# Patient Record
Sex: Male | Born: 1960 | Race: White | Hispanic: No | Marital: Single | Smoking: Never smoker
Health system: Southern US, Community
[De-identification: ages and names within clinical notes are randomized; demographics above are authoritative.]

## PROBLEM LIST (undated history)

## (undated) ENCOUNTER — Emergency Department: Payer: Self-pay

## (undated) DIAGNOSIS — Z86010 Personal history of colonic polyps: Secondary | ICD-10-CM

## (undated) DIAGNOSIS — N189 Chronic kidney disease, unspecified: Secondary | ICD-10-CM

## (undated) HISTORY — PX: HERNIA REPAIR: SHX51

## (undated) HISTORY — DX: Personal history of colonic polyps: Z86.010

---

## 2005-01-22 ENCOUNTER — Ambulatory Visit: Payer: Self-pay | Admitting: General Surgery

## 2006-03-11 ENCOUNTER — Ambulatory Visit: Payer: Self-pay | Admitting: Family Medicine

## 2006-04-07 ENCOUNTER — Ambulatory Visit: Payer: Self-pay | Admitting: Family Medicine

## 2010-09-23 ENCOUNTER — Encounter: Payer: Self-pay | Admitting: Family Medicine

## 2010-09-23 ENCOUNTER — Ambulatory Visit: Payer: Self-pay | Admitting: General Surgery

## 2010-10-01 NOTE — Op Note (Signed)
Summary: [facility] repair-Dr. Donnalee Curry   Hernia Repair-Dr. Donnalee Curry   Imported By: Maryln Gottron 09/27/2010 15:33:43  _____________________________________________________________________  External Attachment:    Type:   Image     Comment:   External Document

## 2011-10-14 ENCOUNTER — Ambulatory Visit: Payer: Self-pay | Admitting: General Surgery

## 2011-10-14 HISTORY — PX: COLONOSCOPY W/ BIOPSIES: SHX1374

## 2011-10-16 LAB — PATHOLOGY REPORT

## 2013-02-17 ENCOUNTER — Encounter: Payer: Self-pay | Admitting: *Deleted

## 2014-11-03 ENCOUNTER — Emergency Department
Admit: 2014-11-03 | Disposition: A | Discharge status: Home / Self Care | Payer: Self-pay | Admitting: Emergency Medicine

## 2014-11-03 LAB — CBC WITH DIFFERENTIAL/PLATELET
Basophil #: 0.1 10*3/uL (ref 0.0–0.1)
Basophil %: 1.2 %
Eosinophil #: 0.2 10*3/uL (ref 0.0–0.7)
Eosinophil %: 2.5 %
HCT: 43 % (ref 40.0–52.0)
HGB: 14.4 g/dL (ref 13.0–18.0)
Lymphocyte #: 3.5 10*3/uL (ref 1.0–3.6)
Lymphocyte %: 40.5 %
MCH: 30.4 pg (ref 26.0–34.0)
MCHC: 33.4 g/dL (ref 32.0–36.0)
MCV: 91 fL (ref 80–100)
Monocyte #: 0.8 x10 3/mm (ref 0.2–1.0)
Monocyte %: 9.4 %
NEUTROS PCT: 46.4 %
Neutrophil #: 4 10*3/uL (ref 1.4–6.5)
PLATELETS: 335 10*3/uL (ref 150–440)
RBC: 4.73 10*6/uL (ref 4.40–5.90)
RDW: 12.6 % (ref 11.5–14.5)
WBC: 8.6 10*3/uL (ref 3.8–10.6)

## 2014-11-03 LAB — BASIC METABOLIC PANEL
ANION GAP: 11 (ref 7–16)
BUN: 20 mg/dL
CREATININE: 0.96 mg/dL
Calcium, Total: 8.9 mg/dL
Chloride: 104 mmol/L
Co2: 22 mmol/L
Glucose: 118 mg/dL — ABNORMAL HIGH
POTASSIUM: 3.3 mmol/L — AB
Sodium: 137 mmol/L

## 2015-11-24 ENCOUNTER — Emergency Department: Payer: Worker's Compensation

## 2015-11-24 ENCOUNTER — Emergency Department
Admission: EM | Admit: 2015-11-24 | Discharge: 2015-11-24 | Disposition: A | Discharge status: Home / Self Care | Payer: Worker's Compensation | Attending: Emergency Medicine | Admitting: Emergency Medicine

## 2015-11-24 DIAGNOSIS — Y929 Unspecified place or not applicable: Secondary | ICD-10-CM | POA: Insufficient documentation

## 2015-11-24 DIAGNOSIS — F1299 Cannabis use, unspecified with unspecified cannabis-induced disorder: Secondary | ICD-10-CM | POA: Diagnosis not present

## 2015-11-24 DIAGNOSIS — S56811A Strain of other muscles, fascia and tendons at forearm level, right arm, initial encounter: Secondary | ICD-10-CM | POA: Diagnosis not present

## 2015-11-24 DIAGNOSIS — X509XXA Other and unspecified overexertion or strenuous movements or postures, initial encounter: Secondary | ICD-10-CM | POA: Insufficient documentation

## 2015-11-24 DIAGNOSIS — Y939 Activity, unspecified: Secondary | ICD-10-CM | POA: Insufficient documentation

## 2015-11-24 DIAGNOSIS — Y999 Unspecified external cause status: Secondary | ICD-10-CM | POA: Diagnosis not present

## 2015-11-24 DIAGNOSIS — S46911A Strain of unspecified muscle, fascia and tendon at shoulder and upper arm level, right arm, initial encounter: Secondary | ICD-10-CM

## 2015-11-24 DIAGNOSIS — S59901A Unspecified injury of right elbow, initial encounter: Secondary | ICD-10-CM | POA: Diagnosis present

## 2015-11-24 MED ORDER — MELOXICAM 15 MG PO TABS: 15.0000 mg | ORAL_TABLET | Freq: Every day | ORAL | Status: DC

## 2015-11-24 NOTE — ED Notes (Addendum)
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 Pain and swelling to right elbow. Heard popping sound

## 2015-11-24 NOTE — Discharge Instructions (Signed)
Cryotherapy °Cryotherapy means treatment with cold. Ice or gel packs can be used to reduce both pain and swelling. Ice is the most helpful within the first 24 to 48 hours after an injury or flare-up from overusing a muscle or joint. Sprains, strains, spasms, burning pain, shooting pain, and aches can all be eased with ice. Ice can also be used when recovering from surgery. Ice is effective, has very few side effects, and is safe for most people to use. °PRECAUTIONS  °Ice is not a safe treatment option for people with: °· Raynaud phenomenon. This is a condition affecting small blood vessels in the extremities. Exposure to cold may cause your problems to return. °· Cold hypersensitivity. There are many forms of cold hypersensitivity, including: °· Cold urticaria. Red, itchy hives appear on the skin when the tissues begin to warm after being iced. °· Cold erythema. This is a red, itchy rash caused by exposure to cold. °· Cold hemoglobinuria. Red blood cells break down when the tissues begin to warm after being iced. The hemoglobin that carry oxygen are passed into the urine because they cannot combine with blood proteins fast enough. °· Numbness or altered sensitivity in the area being iced. °If you have any of the following conditions, do not use ice until you have discussed cryotherapy with your caregiver: °· Heart conditions, such as arrhythmia, angina, or chronic heart disease. °· High blood pressure. °· Healing wounds or open skin in the area being iced. °· Current infections. °· Rheumatoid arthritis. °· Poor circulation. °· Diabetes. °Ice slows the blood flow in the region it is applied. This is beneficial when trying to stop inflamed tissues from spreading irritating chemicals to surrounding tissues. However, if you expose your skin to cold temperatures for too long or without the proper protection, you can damage your skin or nerves. Watch for signs of skin damage due to cold. °HOME CARE INSTRUCTIONS °Follow  these tips to use ice and cold packs safely. °· Place a dry or damp towel between the ice and skin. A damp towel will cool the skin more quickly, so you may need to shorten the time that the ice is used. °· For a more rapid response, add gentle compression to the ice. °· Ice for no more than 10 to 20 minutes at a time. The bonier the area you are icing, the less time it will take to get the benefits of ice. °· Check your skin after 5 minutes to make sure there are no signs of a poor response to cold or skin damage. °· Rest 20 minutes or more between uses. °· Once your skin is numb, you can end your treatment. You can test numbness by very lightly touching your skin. The touch should be so light that you do not see the skin dimple from the pressure of your fingertip. When using ice, most people will feel these normal sensations in this order: cold, burning, aching, and numbness. °· Do not use ice on someone who cannot communicate their responses to pain, such as small children or people with dementia. °HOW TO MAKE AN ICE PACK °Ice packs are the most common way to use ice therapy. Other methods include ice massage, ice baths, and cryosprays. Muscle creams that cause a cold, tingly feeling do not offer the same benefits that ice offers and should not be used as a substitute unless recommended by your caregiver. °To make an ice pack, do one of the following: °· Place crushed ice or a   bag of frozen vegetables in a sealable plastic bag. Squeeze out the excess air. Place this bag inside another plastic bag. Slide the bag into a pillowcase or place a damp towel between your skin and the bag. °· Mix 3 parts water with 1 part rubbing alcohol. Freeze the mixture in a sealable plastic bag. When you remove the mixture from the freezer, it will be slushy. Squeeze out the excess air. Place this bag inside another plastic bag. Slide the bag into a pillowcase or place a damp towel between your skin and the bag. °SEEK MEDICAL CARE  IF: °· You develop white spots on your skin. This may give the skin a blotchy (mottled) appearance. °· Your skin turns blue or pale. °· Your skin becomes waxy or hard. °· Your swelling gets worse. °MAKE SURE YOU:  °· Understand these instructions. °· Will watch your condition. °· Will get help right away if you are not doing well or get worse. °  °This information is not intended to replace advice given to you by your health care provider. Make sure you discuss any questions you have with your health care provider. °  °Document Released: 03/17/2011 Document Revised: 08/11/2014 Document Reviewed: 03/17/2011 °Elsevier Interactive Patient Education ©2016 [employer] ° °Muscle Strain °A muscle strain is an injury that occurs when a muscle is stretched beyond its normal length. Usually a small number of muscle fibers are torn when this happens. Muscle strain is rated in degrees. First-degree strains have the least amount of muscle fiber tearing and pain. Second-degree and third-degree strains have increasingly more tearing and pain.  °Usually, recovery from muscle strain takes 1-2 weeks. Complete healing takes 5-6 weeks.  °CAUSES  °Muscle strain happens when a sudden, violent force placed on a muscle stretches it too far. This may occur with lifting, sports, or a fall.  °RISK FACTORS °Muscle strain is especially common in athletes.  °SIGNS AND SYMPTOMS °At the site of the muscle strain, there may be: °· Pain. °· Bruising. °· Swelling. °· Difficulty using the muscle due to pain or lack of normal function. °DIAGNOSIS  °Your health care provider will perform a physical exam and ask about your medical history. °TREATMENT  °Often, the best treatment for a muscle strain is resting, icing, and applying cold compresses to the injured area.   °HOME CARE INSTRUCTIONS  °· Use the PRICE method of treatment to promote muscle healing during the first 2-3 days after your injury. The PRICE method involves: °¨ Protecting the muscle  from being injured again. °¨ Restricting your activity and resting the injured body part. °¨ Icing your injury. To do this, put ice in a plastic bag. Place a towel between your skin and the bag. Then, apply the ice and leave it on from 15-20 minutes each hour. After the third day, switch to moist heat packs. °¨ Apply compression to the injured area with a splint or elastic bandage. Be careful not to wrap it too tightly. This may interfere with blood circulation or increase swelling. °¨ Elevate the injured body part above the level of your heart as often as you can. °· Only take over-the-counter or prescription medicines for pain, discomfort, or fever as directed by your health care provider. °· Warming up prior to exercise helps to prevent future muscle strains. °SEEK MEDICAL CARE IF:  °· You have increasing pain or swelling in the injured area. °· You have numbness, tingling, or a significant loss of strength in the injured area. °MAKE SURE   YOU:  °· Understand these instructions. °· Will watch your condition. °· Will get help right away if you are not doing well or get worse. °  °This information is not intended to replace advice given to you by your health care provider. Make sure you discuss any questions you have with your health care provider. °  °Document Released: 07/21/2005 Document Revised: 05/11/2013 Document Reviewed: 02/17/2013 °Elsevier Interactive Patient Education ©2016 [employer]vier Inc. ° °

## 2015-11-24 NOTE — ED Provider Notes (Signed)
[facility] Emergency Department Provider Note  ____________________________________________  Time seen: Approximately 4:37 PM  I have reviewed the triage vital signs and the nursing notes.   HISTORY  Chief Complaint Elbow Injury    HPI Harold Ayala is a 55 y.o. male who presents emergency department complaining of right elbow pain. 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 She reports that it chart his right arm/elbow. He felt/heard a popping sensation/7. Patient reports edema to the posterior aspect of the elbow. He reports pain that is constant, sharp in nature. He reports being able to move the elbow in all fields of motion. He denies any numbness or tingling distal to injury site.   Past Medical History  Diagnosis Date  . Personal history of colonic polyps     There are no active problems to display for this patient.   Past Surgical History  Procedure Laterality Date  . Hernia repair  [phone]    Current Outpatient Rx  Name  Route  Sig  Dispense  Refill  . meloxicam (MOBIC) 15 MG tablet   Oral   Take 1 tablet (15 mg total) by mouth daily.   30 tablet   0     Allergies Review of patient's allergies indicates no known allergies.  No family history on file.  Social History Social History  Substance Use Topics  . Smoking status: Never Smoker   . Smokeless tobacco: Never Used  . Alcohol Use: Yes     Review of Systems  Constitutional: No fever/chills Cardiovascular: no chest pain. Respiratory: no cough. No SOB. Musculoskeletal: Positive for right elbow pain and swelling. Skin: Negative for rash. Neurological: Negative for headaches, focal weakness or numbness. 10-point ROS otherwise negative.  ____________________________________________   PHYSICAL EXAM:  VITAL SIGNS: ED Triage Vitals  Enc Vitals Group     BP 11/24/15 1614  138/88 mmHg     Pulse Rate 11/24/15 1614 74     Resp 11/24/15 1614 18     Temp 11/24/15 1614 98 F (36.7 C)     Temp Source 11/24/15 1614 Oral     SpO2 11/24/15 1614 96 %     Weight 11/24/15 1614 165 lb (74.844 kg)     Height 11/24/15 1614  (1.803 m)     Head Cir --      Peak Flow --      Pain Score 11/24/15 1616 4     Pain Loc --      Pain Edu? --      Excl. in GC? --      Constitutional: Alert and oriented. Well appearing and in no acute distress. Eyes: Conjunctivae are normal. PERRL. EOMI. Head: Atraumatic. Cardiovascular: Normal rate, regular rhythm. Normal S1 and S2.  Good peripheral circulation. Respiratory: Normal respiratory effort without tachypnea or retractions. Lungs CTAB. Musculoskeletal: Edema is noted to the posterior aspect of the right elbow. No gross deformity. Full range of motion to elbow. Full resisted range of motion to right elbow. Patient is tender to palpation over the posterior proximal aspect of the elbow. Positive ballottement. No palpable abnormality. Radial pulse intact distally. Sensation intact and equal to the unaffected extremity. Neurologic:  Normal speech and language. No gross focal neurologic deficits are appreciated.  Skin:  Skin is warm, dry and intact. No rash noted. Psychiatric: Mood and affect are normal. Speech and behavior are normal.  Patient exhibits appropriate insight and judgement.   ____________________________________________   LABS (all labs ordered are listed, but only abnormal results are displayed)  Labs Reviewed - No data to display ____________________________________________  EKG   ____________________________________________  RADIOLOGY Festus BarrenI, Rekia Kujala D Cindie Rajagopalan, personally viewed and evaluated these images (plain radiographs) as part of my medical decision making, as well as reviewing the written report by the radiologist.  Dg Elbow Complete Right  11/24/2015  CLINICAL DATA:  Worker Comp. Pt was moving  concrete blocks with a hand truck and it jerked his arm today. Pain and swelling to right elbow. Heard popping sound EXAM: RIGHT ELBOW - COMPLETE 3+ VIEW COMPARISON:  None. FINDINGS: No evidence of fracture of the ulna or humerus. The radial head is normal. No joint effusion. Soft tissue swelling posterior to the distal ulna. IMPRESSION: No fracture or dislocation. Electronically Signed   By: Genevive BiStewart  Edmunds M.D.   On: 11/24/2015 17:12    ____________________________________________    PROCEDURES  Procedure(s) performed:       Medications - No data to display   ____________________________________________   INITIAL IMPRESSION / ASSESSMENT AND PLAN / ED COURSE  Pertinent labs & imaging results that were available during my care of the patient were reviewed by me and considered in my medical decision making (see chart for details).  Patient's diagnosis is consistent with right elbow strain. Exam is reassuring. X-ray reveals no acute osseous abnormality.. Patient will be discharged home with prescriptions for anti-inflammatories. Patient is to follow up with orthopedics if symptoms persist past this treatment course. Patient is given ED precautions to return to the ED for any worsening or new symptoms.     ____________________________________________  FINAL CLINICAL IMPRESSION(S) / ED DIAGNOSES  Final diagnoses:  Elbow strain, right, initial encounter      NEW MEDICATIONS STARTED DURING THIS VISIT:  New Prescriptions   MELOXICAM (MOBIC) 15 MG TABLET    Take 1 tablet (15 mg total) by mouth daily.        This chart was dictated using voice recognition software/Dragon. Despite best efforts to proofread, errors can occur which can change the meaning. Any change was purely unintentional.    Racheal PatchesJonathan D Kiylah Loyer, PA-C 11/24/15 1730  Sharman CheekPhillip Stafford, MD 11/26/15 0020

## 2016-03-18 ENCOUNTER — Encounter
Admission: RE | Admit: 2016-03-18 | Discharge: 2016-03-18 | Disposition: A | Discharge status: Home / Self Care | Payer: Self-pay | Source: Ambulatory Visit | Attending: Orthopedic Surgery | Admitting: Orthopedic Surgery

## 2016-03-18 HISTORY — DX: Chronic kidney disease, unspecified: N18.9

## 2016-03-18 NOTE — Patient Instructions (Signed)
  Your procedure is scheduled on: 03-20-16 Report to Same Day Surgery 2nd floor medical mall To find out your arrival time please call [phone] between 1PM - 3PM on 03-19-16  Remember: Instructions that are not followed completely may result in serious medical risk, up to and including death, or upon the discretion of your surgeon and anesthesiologist your surgery may need to be rescheduled.    _x___ 1. Do not eat food or drink liquids after midnight. No gum chewing or hard candies.     __x__ 2. No Alcohol for 24 hours before or after surgery.   __x__3. No Smoking for 24 prior to surgery.   ____  4. Bring all medications with you on the day of surgery if instructed.    __x__ 5. Notify your doctor if there is any change in your medical condition     (cold, fever, infections).     Do not wear jewelry, make-up, hairpins, clips or nail polish.  Do not wear lotions, powders, or perfumes. You may wear deodorant.  Do not shave 48 hours prior to surgery. Men may shave face and neck.  Do not bring valuables to the hospital.    [facility] is not responsible for any belongings or valuables.               Contacts, dentures or bridgework may not be worn into surgery.  Leave your suitcase in the car. After surgery it may be brought to your room.  For patients admitted to the hospital, discharge time is determined by your treatment team.   Patients discharged the day of surgery will not be allowed to drive home.    Please read over the following fact sheets that you were given:   [facility] Preparing for Surgery and or MRSA Information   ____ Take these medicines the morning of surgery with A SIP OF WATER:    1. NONE  2.  3.  4.  5.  6.  ____ Fleet Enema (as directed)   ____ Use CHG Soap or sage wipes as directed on instruction sheet   ____ Use inhalers on the day of surgery and bring to hospital day of surgery  ____ Stop metformin 2 days prior to surgery    ____ Take 1/2 of  usual insulin dose the night before surgery and none on the morning of  surgery.   _X___ Stop aspirin or coumadin, or plavix-STOP ASA NOW  _x__ Stop Anti-inflammatories such as Advil, Aleve, Ibuprofen, Motrin, Naproxen,          Naprosyn, Goodies powders or aspirin products NOW-Ok to take Tylenol.   _X___ Stop supplements until after surgery-STOP GLUCOSAMINE-CHONDROITIN AND FISH OIL NOW  ____ Bring C-Pap to the hospital.

## 2016-03-19 ENCOUNTER — Encounter: Payer: Self-pay | Admitting: *Deleted

## 2016-03-20 ENCOUNTER — Ambulatory Visit: Payer: Worker's Compensation | Admitting: Anesthesiology

## 2016-03-20 ENCOUNTER — Encounter
Admission: RE | Disposition: A | Discharge status: Home / Self Care | Payer: Self-pay | Source: Ambulatory Visit | Attending: Orthopedic Surgery

## 2016-03-20 ENCOUNTER — Encounter: Payer: Self-pay | Admitting: *Deleted

## 2016-03-20 ENCOUNTER — Ambulatory Visit
Admission: RE | Admit: 2016-03-20 | Discharge: 2016-03-20 | Disposition: A | Discharge status: Home / Self Care | Payer: Worker's Compensation | Source: Ambulatory Visit | Attending: Orthopedic Surgery | Admitting: Orthopedic Surgery

## 2016-03-20 DIAGNOSIS — Z8261 Family history of arthritis: Secondary | ICD-10-CM | POA: Diagnosis not present

## 2016-03-20 DIAGNOSIS — Z79899 Other long term (current) drug therapy: Secondary | ICD-10-CM | POA: Insufficient documentation

## 2016-03-20 DIAGNOSIS — X58XXXA Exposure to other specified factors, initial encounter: Secondary | ICD-10-CM | POA: Diagnosis not present

## 2016-03-20 DIAGNOSIS — S46391A Other injury of muscle, fascia and tendon of triceps, right arm, initial encounter: Secondary | ICD-10-CM | POA: Diagnosis present

## 2016-03-20 DIAGNOSIS — Z87442 Personal history of urinary calculi: Secondary | ICD-10-CM | POA: Insufficient documentation

## 2016-03-20 DIAGNOSIS — Z833 Family history of diabetes mellitus: Secondary | ICD-10-CM | POA: Diagnosis not present

## 2016-03-20 DIAGNOSIS — Y939 Activity, unspecified: Secondary | ICD-10-CM | POA: Diagnosis not present

## 2016-03-20 DIAGNOSIS — Z8249 Family history of ischemic heart disease and other diseases of the circulatory system: Secondary | ICD-10-CM | POA: Insufficient documentation

## 2016-03-20 DIAGNOSIS — Z86010 Personal history of colonic polyps: Secondary | ICD-10-CM | POA: Diagnosis not present

## 2016-03-20 HISTORY — PX: TRICEPS TENDON REPAIR: SHX2577

## 2016-03-20 LAB — URINE DRUG SCREEN, QUALITATIVE (ARMC ONLY)
AMPHETAMINES, UR SCREEN: NOT DETECTED
Barbiturates, Ur Screen: NOT DETECTED
Benzodiazepine, Ur Scrn: NOT DETECTED
CANNABINOID 50 NG, UR ~~LOC~~: POSITIVE — AB
Cocaine Metabolite,Ur ~~LOC~~: NOT DETECTED
MDMA (ECSTASY) UR SCREEN: NOT DETECTED
METHADONE SCREEN, URINE: NOT DETECTED
Opiate, Ur Screen: NOT DETECTED
Phencyclidine (PCP) Ur S: NOT DETECTED
TRICYCLIC, UR SCREEN: NOT DETECTED

## 2016-03-20 SURGERY — REPAIR, TENDON, TRICEPS
Anesthesia: General | Laterality: Right

## 2016-03-20 MED ORDER — LIDOCAINE HCL (CARDIAC) 20 MG/ML IV SOLN
INTRAVENOUS | Status: DC | PRN
  Administered 2016-03-20: 80 mg via INTRAVENOUS

## 2016-03-20 MED ORDER — LACTATED RINGERS IV SOLN
INTRAVENOUS | Status: DC
  Administered 2016-03-20 (×2): via INTRAVENOUS

## 2016-03-20 MED ORDER — FENTANYL CITRATE (PF) 100 MCG/2ML IJ SOLN
INTRAMUSCULAR | Status: AC
  Administered 2016-03-20: 50 ug via INTRAVENOUS
  Filled 2016-03-20: qty 2

## 2016-03-20 MED ORDER — HYDROCODONE-ACETAMINOPHEN 7.5-325 MG PO TABS
ORAL_TABLET | ORAL | Status: AC
  Administered 2016-03-20: 1
  Filled 2016-03-20: qty 1

## 2016-03-20 MED ORDER — CEFAZOLIN SODIUM-DEXTROSE 2-4 GM/100ML-% IV SOLN
INTRAVENOUS | Status: AC
  Filled 2016-03-20: qty 100

## 2016-03-20 MED ORDER — NEOMYCIN-POLYMYXIN B GU 40-200000 IR SOLN
Status: AC
  Filled 2016-03-20: qty 2

## 2016-03-20 MED ORDER — NEOMYCIN-POLYMYXIN B GU 40-200000 IR SOLN
Status: DC | PRN
  Administered 2016-03-20: 2 mL

## 2016-03-20 MED ORDER — FENTANYL CITRATE (PF) 100 MCG/2ML IJ SOLN
INTRAMUSCULAR | Status: DC | PRN
  Administered 2016-03-20 (×2): 50 ug via INTRAVENOUS

## 2016-03-20 MED ORDER — HYDROCODONE-ACETAMINOPHEN 7.5-325 MG PO TABS
1.0000 | ORAL_TABLET | Freq: Four times a day (QID) | ORAL | 0 refills | Status: DC | PRN
Start: 2016-03-20 — End: 2022-06-28

## 2016-03-20 MED ORDER — PROPOFOL 10 MG/ML IV BOLUS
INTRAVENOUS | Status: DC | PRN
  Administered 2016-03-20: 150 mg via INTRAVENOUS
  Administered 2016-03-20: 30 mg via INTRAVENOUS

## 2016-03-20 MED ORDER — ONDANSETRON HCL 4 MG/2ML IJ SOLN
INTRAMUSCULAR | Status: DC | PRN
  Administered 2016-03-20: 4 mg via INTRAVENOUS

## 2016-03-20 MED ORDER — FAMOTIDINE 20 MG PO TABS
20.0000 mg | ORAL_TABLET | Freq: Once | ORAL | Status: AC
  Administered 2016-03-20: 20 mg via ORAL

## 2016-03-20 MED ORDER — FENTANYL CITRATE (PF) 100 MCG/2ML IJ SOLN
25.0000 ug | INTRAMUSCULAR | Status: DC | PRN
  Administered 2016-03-20: 50 ug via INTRAVENOUS
  Administered 2016-03-20 (×2): 25 ug via INTRAVENOUS
  Administered 2016-03-20: 50 ug via INTRAVENOUS

## 2016-03-20 MED ORDER — CEFAZOLIN SODIUM-DEXTROSE 2-4 GM/100ML-% IV SOLN
2.0000 g | Freq: Once | INTRAVENOUS | Status: AC
  Administered 2016-03-20: 2 g via INTRAVENOUS

## 2016-03-20 MED ORDER — PROMETHAZINE HCL 25 MG/ML IJ SOLN: 6.2500 mg | INTRAMUSCULAR | Status: DC | PRN

## 2016-03-20 MED ORDER — FAMOTIDINE 20 MG PO TABS
ORAL_TABLET | ORAL | Status: AC
  Filled 2016-03-20: qty 1

## 2016-03-20 MED ORDER — MIDAZOLAM HCL 2 MG/2ML IJ SOLN
INTRAMUSCULAR | Status: DC | PRN
  Administered 2016-03-20: 2 mg via INTRAVENOUS

## 2016-03-20 SURGICAL SUPPLY — 48 items
ANCHOR SUT 5.5 SPEEDSCREW (Screw) ×2 IMPLANT
BANDAGE ELASTIC 4 LF NS (GAUZE/BANDAGES/DRESSINGS) ×2 IMPLANT
BNDG CMPR MED 5X4 ELC HKLP NS (GAUZE/BANDAGES/DRESSINGS) ×1
CANISTER SUCT 1200ML W/VALVE (MISCELLANEOUS) ×2 IMPLANT
CHLORAPREP W/TINT 26ML (MISCELLANEOUS) ×2 IMPLANT
COVER MAYO STAND STRL (DRAPES) IMPLANT
CUFF TOURN 24 STER (MISCELLANEOUS) IMPLANT
CUFF TOURN SGL QUICK 18 (TOURNIQUET CUFF) ×2 IMPLANT
DRAPE U-SHAPE 47X51 STRL (DRAPES) ×2 IMPLANT
ELECT CAUTERY BLADE 6.4 (BLADE) ×2 IMPLANT
GAUZE PETRO XEROFOAM 1X8 (MISCELLANEOUS) ×2 IMPLANT
GAUZE SPONGE 4X4 12PLY STRL (GAUZE/BANDAGES/DRESSINGS) ×2 IMPLANT
GLOVE BIO SURGEON STRL SZ7 (GLOVE) ×2 IMPLANT
GLOVE BIO SURGEON STRL SZ7.5 (GLOVE) ×2 IMPLANT
GLOVE BIOGEL PI IND STRL 7.5 (GLOVE) ×1 IMPLANT
GLOVE BIOGEL PI IND STRL 9 (GLOVE) ×1 IMPLANT
GLOVE BIOGEL PI INDICATOR 7.5 (GLOVE) ×1
GLOVE BIOGEL PI INDICATOR 9 (GLOVE) ×1
GLOVE INDICATOR 7.5 STRL GRN (GLOVE) ×2 IMPLANT
GLOVE SURG ORTHO 9.0 STRL STRW (GLOVE) ×2 IMPLANT
GOWN L4 LG 24 PK N/S (GOWN DISPOSABLE) ×4 IMPLANT
GOWN STRL REUS W/ TWL LRG LVL3 (GOWN DISPOSABLE) ×1 IMPLANT
GOWN STRL REUS W/TWL LRG LVL3 (GOWN DISPOSABLE) ×2
GOWN SURG XXL (GOWNS) ×2 IMPLANT
KIT RM TURNOVER STRD PROC AR (KITS) ×2 IMPLANT
NDL MAYO CATGUT SZ5 (NEEDLE)
NDL SUT 5 .5 CRC TPR PNT MAYO (NEEDLE) IMPLANT
NS IRRIG 500ML POUR BTL (IV SOLUTION) ×2 IMPLANT
PACK EXTREMITY ARMC (MISCELLANEOUS) ×2 IMPLANT
PAD CAST CTTN 4X4 STRL (SOFTGOODS) ×2 IMPLANT
PAD GROUND ADULT SPLIT (MISCELLANEOUS) ×2 IMPLANT
PADDING CAST 4IN STRL (MISCELLANEOUS) ×2
PADDING CAST BLEND 4X4 STRL (MISCELLANEOUS) ×2 IMPLANT
PADDING CAST COTTON 4X4 STRL (SOFTGOODS) ×4
SLING ARM M TX990204 (SOFTGOODS) ×2 IMPLANT
SPLINT CAST 1 STEP 3X12 (MISCELLANEOUS) ×4 IMPLANT
SPLINT CAST 1 STEP 4X15 (MISCELLANEOUS) IMPLANT
SPLINT CAST 1 STEP 5X30 WHT (MISCELLANEOUS) IMPLANT
STAPLER SKIN PROX 35W (STAPLE) IMPLANT
STOCKINETTE STRL 6IN 960660 (GAUZE/BANDAGES/DRESSINGS) ×2 IMPLANT
SUT ETHILON 4-0 (SUTURE)
SUT ETHILON 4-0 FS2 18XMFL BLK (SUTURE)
SUT ETHILON NAB PS2 4-0 18IN (SUTURE) ×4 IMPLANT
SUT VIC AB 2-0 CT1 27 (SUTURE) ×2
SUT VIC AB 2-0 CT1 TAPERPNT 27 (SUTURE) ×1 IMPLANT
SUTURE ETHLN 4-0 FS2 18XMF BLK (SUTURE) IMPLANT
SYRINGE 10CC LL (SYRINGE) ×2 IMPLANT
WIRE MAGNUM (SUTURE) ×4 IMPLANT

## 2016-03-20 NOTE — Discharge Instructions (Addendum)
Keep arm elevated. Ice to the side of the elbow. Today and tomorrow, on and off for pain relief. Pain medicine as directed.   AMBULATORY SURGERY  DISCHARGE INSTRUCTIONS   1) The drugs that you were given will stay in your system until tomorrow so for the next 24 hours you should not:  A) Drive an automobile B) Make any legal decisions C) Drink any alcoholic beverage   2) You may resume regular meals tomorrow.  Today it is better to start with liquids and gradually work up to solid foods.  You may eat anything you prefer, but it is better to start with liquids, then soup and crackers, and gradually work up to solid foods.   3) Please notify your doctor immediately if you have any unusual bleeding, trouble breathing, redness and pain at the surgery site, drainage, fever, or pain not relieved by medication.    4) Additional Instructions:        Please contact your physician with any problems or Same Day Surgery at [phone], Monday through Friday 6 am to 4 pm, or [place] at [facility] Main number at [phone].

## 2016-03-20 NOTE — H&P (Signed)
Reviewed paper H+P, will be scanned into chart. No changes noted.  

## 2016-03-20 NOTE — Anesthesia Preprocedure Evaluation (Addendum)
Anesthesia Evaluation  Patient identified by MRN, date of birth, ID band Patient awake    Reviewed: Allergy & Precautions, H&P , NPO status , Patient's Chart, lab work & pertinent test results, reviewed documented beta blocker date and time   History of Anesthesia Complications Negative for: history of anesthetic complications  Airway Mallampati: I  TM Distance: >3 FB Neck ROM: full    Dental no notable dental hx. (+) Caps, Missing   Pulmonary neg shortness of breath, neg sleep apnea, neg COPD, Recent URI , Residual Cough,    Pulmonary exam normal breath sounds clear to auscultation       Cardiovascular Exercise Tolerance: Good negative cardio ROS Normal cardiovascular exam Rhythm:regular Rate:Normal     Neuro/Psych negative neurological ROS  negative psych ROS   GI/Hepatic negative GI ROS, Neg liver ROS,   Endo/Other  negative endocrine ROS  Renal/GU Renal disease (kidney stone)  negative genitourinary   Musculoskeletal   Abdominal   Peds  Hematology negative hematology ROS (+)   Anesthesia Other Findings Past Medical History: No date: Chronic kidney disease     Comment: H/O STONE No date: Personal history of colonic polyps   Reproductive/Obstetrics negative OB ROS                             Anesthesia Physical Anesthesia Plan  ASA: II  Anesthesia Plan: General and Regional   Post-op Pain Management:    Induction:   Airway Management Planned:   Additional Equipment:   Intra-op Plan:   Post-operative Plan:   Informed Consent: I have reviewed the patients History and Physical, chart, labs and discussed the procedure including the risks, benefits and alternatives for the proposed anesthesia with the patient or authorized representative who has indicated his/her understanding and acceptance.   Dental Advisory Given  Plan Discussed with: Anesthesiologist, CRNA and  Surgeon  Anesthesia Plan Comments:        Anesthesia Quick Evaluation

## 2016-03-20 NOTE — Anesthesia Procedure Notes (Signed)
Procedure Name: LMA Insertion Date/Time: 03/20/2016 12:11 PM Performed by: Harold Ayala, Harold Ayala Patient Re-evaluated:Patient Re-evaluated prior to inductionOxygen Delivery Method: Circle system utilized Preoxygenation: Pre-oxygenation with 100% oxygen Intubation Type: Combination inhalational/ intravenous induction Ventilation: Mask ventilation without difficulty LMA: LMA inserted LMA Size: 4.0 Grade View: Grade II Tube type: Oral Number of attempts: 1 Placement Confirmation: positive ETCO2,  CO2 detector and breath sounds checked- equal and bilateral Tube secured with: Tape Dental Injury: Teeth and Oropharynx as per pre-operative assessment

## 2016-03-20 NOTE — Op Note (Signed)
03/20/2016  1:26 PM  PATIENT:  Harold Ayala  55 y.o. male  PRE-OPERATIVE DIAGNOSIS:  triceps tendon rupture right  POST-OPERATIVE DIAGNOSIS:  right triceps tendon rupture  PROCEDURE:  Procedure(s): TRICEPS TENDON REPAIR (Right)  SURGEON: Leitha SchullerMichael J Leylani Duley, MD  ASSISTANTS: None  ANESTHESIA:   general  EBL:  Total I/O In: 1100 [I.V.:1100] Out: -   BLOOD ADMINISTERED:none  DRAINS: none   LOCAL MEDICATIONS USED:  NONE  SPECIMEN:  No Specimen  DISPOSITION OF SPECIMEN:  N/A  COUNTS:  YES  TOURNIQUET:   Total Tourniquet Time Documented: Upper Arm (Right) - 51 minutes Total: Upper Arm (Right) - 51 minutes   IMPLANTS: Speech screw 5.5 mm anchor 2  DICTATION: .Dragon Dictation patient was brought the operating room and after adequate anesthesia was obtained the right arm was prepped and draped in sterile fashion. After patient identification and timeout procedures were completed the right arm was approached through a posterior lateral incision the tourniquet raised. After getting down to the skin and subcutaneous tissues tissue the triceps appeared to have completely avulsed and X incision was extended proximally to allow for grasping of the torn tendon the tendon could be brought down within a centimeter of the bone with the elbow in extension. The tendon was freed up around separating it from the scar tissue surgery brought sensory to the bone with the elbow in extension the olecranon was exposed and 2 holes were made for suture anchors sutures were woven through the triceps tendon 11 medial and lateral and these were put through the speed screws anchors is that after they have been placed in the bone with the elbow in extension and tightening the speed screw the tendons could be brought back to the olecranon. The sutures within the speed screw then used to weave back into the tendon to reinforce the repair the elbow could be flexed to 80 without undue tension on the repair and was  left in this position for the remainder of the case except for further extension with closing. the wound was thoroughly irrigated and then the wound closed with 2-0 Vicryl subcutaneously and 4-0 nylon for the skin with a Xeroform 4 x 4 web roll and splint dressing applied with the elbow at 80 of flexion. Ace wrap applied over the splints  PLAN OF CARE: Discharge to home after PACU  PATIENT DISPOSITION:  PACU - hemodynamically stable.

## 2016-03-20 NOTE — Anesthesia Procedure Notes (Signed)
Performed by: Clothilde Tippetts       

## 2016-03-20 NOTE — Anesthesia Procedure Notes (Signed)
Performed by: Salimah Martinovich       

## 2016-03-20 NOTE — Transfer of Care (Signed)
Immediate Anesthesia Transfer of Care Note  Patient: Harold Ayala  Procedure(s) Performed: Procedure(s): TRICEPS TENDON REPAIR (Right)  Patient Location: PACU  Anesthesia Type:General  Level of Consciousness: sedated  Airway & Oxygen Therapy: Patient Spontanous Breathing and Patient connected to face mask oxygen  Post-op Assessment: Report given to RN and Post -op Vital signs reviewed and stable  Post vital signs: Reviewed and stable  Last Vitals:  Vitals:   03/20/16 0852  BP: (!) 145/79  Pulse: 61  Resp: 16  Temp: 36.4 C    Last Pain:  Vitals:   03/20/16 0852  TempSrc: Oral         Complications: No apparent anesthesia complications

## 2016-03-20 NOTE — Anesthesia Procedure Notes (Signed)
Anesthesia Procedure Note     

## 2016-03-20 NOTE — Anesthesia Procedure Notes (Signed)
Performed by: Tayvon Culley       

## 2016-03-21 NOTE — Anesthesia Postprocedure Evaluation (Signed)
Anesthesia Post Note  Patient: Harold D EvansDeforest Hoyles  Procedure(s) Performed: Procedure(s) (LRB): TRICEPS TENDON REPAIR (Right)  Patient location during evaluation: PACU Anesthesia Type: General Level of consciousness: awake and alert Pain management: pain level controlled Vital Signs Assessment: post-procedure vital signs reviewed and stable Respiratory status: spontaneous breathing, nonlabored ventilation, respiratory function stable and patient connected to nasal cannula oxygen Cardiovascular status: blood pressure returned to baseline and stable Postop Assessment: no signs of nausea or vomiting Anesthetic complications: no    Last Vitals:  Vitals:   03/20/16 1430 03/20/16 1448  BP: (!) 136/92 133/90  Pulse: 60 (!) 53  Resp: 16 16  Temp:      Last Pain:  Vitals:   03/20/16 1430  TempSrc:   PainSc: 4                  Lenard SimmerAndrew Olin Gurski

## 2016-09-30 ENCOUNTER — Encounter: Payer: Self-pay | Admitting: *Deleted

## 2016-10-09 ENCOUNTER — Ambulatory Visit (INDEPENDENT_AMBULATORY_CARE_PROVIDER_SITE_OTHER): Payer: Worker's Compensation | Admitting: General Surgery

## 2016-10-09 ENCOUNTER — Encounter: Payer: Self-pay | Admitting: General Surgery

## 2016-10-09 VITALS — BP 152/98 | HR 68 | Resp 12 | Ht 71.0 in | Wt 176.0 lb

## 2016-10-09 DIAGNOSIS — R1032 Left lower quadrant pain: Secondary | ICD-10-CM | POA: Diagnosis not present
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
## 2016-10-09 NOTE — Patient Instructions (Addendum)
Patient to return as needed. Patient to take two Advil two times for a week.

## 2016-10-10 ENCOUNTER — Telehealth: Payer: Self-pay | Admitting: *Deleted

## 2016-10-10 NOTE — Telephone Encounter (Signed)
Notified patient as instructed, patient pleased. Discussed follow-up appointments, patient agrees  

## 2016-10-10 NOTE — Telephone Encounter (Signed)
-----   Message from Earline MayotteJeffrey W Byrnett, MD sent at 10/09/2016  8:06 PM EST ----- The patient was verbally instructed to make use of Aleve, 2 tablets twice a day, but his discharge note report Advil, 2 tablets twice a day.  Please notify him that I would like him to make use of Aleve, 2 tablets twice a day and give a phone follow-up in a week-10 days.  He is due for a follow-up colonoscopy as he had a polyp identified during his March 2013 exam.

## 2016-10-13 ENCOUNTER — Ambulatory Visit: Payer: Self-pay | Admitting: General Surgery

## 2016-10-28 ENCOUNTER — Encounter: Payer: Self-pay | Admitting: General Surgery

## 2017-03-02 ENCOUNTER — Ambulatory Visit: Payer: Self-pay | Admitting: General Surgery

## 2018-04-08 ENCOUNTER — Ambulatory Visit: Payer: Worker's Compensation | Admitting: General Surgery

## 2018-08-15 ENCOUNTER — Encounter: Payer: Self-pay | Admitting: Emergency Medicine

## 2018-08-15 ENCOUNTER — Emergency Department: Payer: Self-pay

## 2018-08-15 ENCOUNTER — Other Ambulatory Visit: Payer: Self-pay

## 2018-08-15 ENCOUNTER — Emergency Department
Admission: EM | Admit: 2018-08-15 | Discharge: 2018-08-15 | Disposition: A | Discharge status: Home / Self Care | Payer: Self-pay | Attending: Emergency Medicine | Admitting: Emergency Medicine

## 2018-08-15 DIAGNOSIS — R112 Nausea with vomiting, unspecified: Secondary | ICD-10-CM | POA: Insufficient documentation

## 2018-08-15 DIAGNOSIS — Z7982 Long term (current) use of aspirin: Secondary | ICD-10-CM | POA: Insufficient documentation

## 2018-08-15 DIAGNOSIS — K2900 Acute gastritis without bleeding: Secondary | ICD-10-CM | POA: Insufficient documentation

## 2018-08-15 DIAGNOSIS — R1013 Epigastric pain: Secondary | ICD-10-CM

## 2018-08-15 DIAGNOSIS — N189 Chronic kidney disease, unspecified: Secondary | ICD-10-CM | POA: Insufficient documentation

## 2018-08-15 LAB — HEPATIC FUNCTION PANEL
ALK PHOS: 58 U/L (ref 38–126)
ALT: 25 U/L (ref 0–44)
AST: 23 U/L (ref 15–41)
Albumin: 4.4 g/dL (ref 3.5–5.0)
BILIRUBIN TOTAL: 0.6 mg/dL (ref 0.3–1.2)
Bilirubin, Direct: <0.1 mg/dL (ref 0.0–0.2)
Total Protein: 7.6 g/dL (ref 6.5–8.1)

## 2018-08-15 LAB — BASIC METABOLIC PANEL
Anion gap: 12 (ref 5–15)
BUN: 21 mg/dL — ABNORMAL HIGH (ref 6–20)
CO2: 22 mmol/L (ref 22–32)
Calcium: 9.7 mg/dL (ref 8.9–10.3)
Chloride: 103 mmol/L (ref 98–111)
Creatinine, Ser: 0.95 mg/dL (ref 0.61–1.24)
GFR calc Af Amer: >60 mL/min (ref >60)
GFR calc non Af Amer: >60 mL/min (ref >60)
Glucose, Bld: 141 mg/dL — ABNORMAL HIGH (ref 70–99)
Potassium: 4 mmol/L (ref 3.5–5.1)
Sodium: 137 mmol/L (ref 135–145)

## 2018-08-15 LAB — CBC
HCT: 44.4 % (ref 39.0–52.0)
Hemoglobin: 15.2 g/dL (ref 13.0–17.0)
MCH: 30.5 pg (ref 26.0–34.0)
MCHC: 34.2 g/dL (ref 30.0–36.0)
MCV: 89 fL (ref 80.0–100.0)
Platelets: 379 10*3/uL (ref 150–400)
RBC: 4.99 MIL/uL (ref 4.22–5.81)
RDW: 12.2 % (ref 11.5–15.5)
WBC: 14.5 10*3/uL — ABNORMAL HIGH (ref 4.0–10.5)
nRBC: 0 % (ref 0.0–0.2)

## 2018-08-15 LAB — LIPASE, BLOOD: Lipase: 36 U/L (ref 11–51)

## 2018-08-15 LAB — TROPONIN I
Troponin I: <0.03 ng/mL (ref <0.03)
Troponin I: <0.03 ng/mL (ref <0.03)

## 2018-08-15 MED ORDER — ALUM & MAG HYDROXIDE-SIMETH 200-200-20 MG/5ML PO SUSP
30.0000 mL | Freq: Once | ORAL | Status: AC
  Administered 2018-08-15: 30 mL via ORAL
  Filled 2018-08-15: qty 30

## 2018-08-15 MED ORDER — LIDOCAINE VISCOUS HCL 2 % MT SOLN
15.0000 mL | Freq: Once | OROMUCOSAL | Status: AC
  Administered 2018-08-15: 15 mL via ORAL
  Filled 2018-08-15: qty 15

## 2018-08-15 MED ORDER — ONDANSETRON 4 MG PO TBDP: 4.0000 mg | ORAL_TABLET | Freq: Three times a day (TID) | ORAL | 0 refills | Status: DC | PRN

## 2018-08-15 MED ORDER — ONDANSETRON HCL 4 MG/2ML IJ SOLN
4.0000 mg | Freq: Once | INTRAMUSCULAR | Status: AC
  Administered 2018-08-15: 4 mg via INTRAVENOUS
  Filled 2018-08-15: qty 2

## 2018-08-15 MED ORDER — FAMOTIDINE IN NACL 20-0.9 MG/50ML-% IV SOLN
20.0000 mg | Freq: Once | INTRAVENOUS | Status: AC
  Administered 2018-08-15: 20 mg via INTRAVENOUS
  Filled 2018-08-15: qty 50

## 2018-08-15 MED ORDER — ONDANSETRON HCL 4 MG/2ML IJ SOLN
4.0000 mg | Freq: Once | INTRAMUSCULAR | Status: AC | PRN
  Administered 2018-08-15: 4 mg via INTRAVENOUS
  Filled 2018-08-15: qty 2

## 2018-08-15 MED ORDER — SODIUM CHLORIDE 0.9 % IV BOLUS
1000.0000 mL | Freq: Once | INTRAVENOUS | Status: AC
  Administered 2018-08-15: 1000 mL via INTRAVENOUS

## 2018-08-15 MED ORDER — FAMOTIDINE 20 MG PO TABS: 20.0000 mg | ORAL_TABLET | Freq: Two times a day (BID) | ORAL | 0 refills | Status: DC

## 2018-08-15 NOTE — ED Triage Notes (Signed)
Epigastric pain with x4 episodes of vomiting today.   Taken Tums and pepto.  Pt feels clammy and " I can not get warm"

## 2018-08-15 NOTE — ED Provider Notes (Signed)
[facility] Emergency Department Provider Note  ____________________________________________  Time seen: Approximately 11:53 AM  I have reviewed the triage vital signs and the nursing notes.   HISTORY  Chief Complaint Chest Pain   HPI Harold Ayala is a 58 y.o. male with a history of chronic kidney disease who presents for evaluation of abdominal pain.  Patient reports developing epigastric sharp abdominal pain last night.  Had 3-4 episodes of nonbloody nonbilious emesis.  2 bowel movements which were not watery, bloody or melena.  He reports that the pain persisted throughout the night.  Associated with mild SOB. This morning the pain had resolved but he is complaining of burning sensation now in his epigastrium.  No alcohol use, no NSAIDs.  Patient thinks he had food poisoning from leftover fajitas he had last night.  No one else has had the same food as he.  No fever chills, no prior abdominal surgeries, no dysuria or hematuria.  Patient has family history of heart attacks in his father.  No personal history of heart attacks.  He is not a smoker.  He does use marijuana. No CP.   Past Medical History:  Diagnosis Date  . Chronic kidney disease    H/O STONE  . Personal history of colonic polyps     Patient Active Problem List   Diagnosis Date Noted  . Left groin pain 10/09/2016    Past Surgical History:  Procedure Laterality Date  . COLONOSCOPY W/ BIOPSIES  10/14/2011   8 mm tubular adenoma without dysplasia in the cecum.  Marland Kitchen. HERNIA REPAIR  [phone],2006,2012  . TRICEPS TENDON REPAIR Right 03/20/2016   Procedure: TRICEPS TENDON REPAIR;  Surgeon: Kennedy BuckerMichael Menz, MD;  Location: [facility] ORS;  Service: Orthopedics;  Laterality: Right;    Prior to Admission medications   Medication Sig Start Date End Date Taking? Authorizing Provider  aspirin EC 81 MG tablet Take 162 mg by mouth daily.     [provider]  famotidine (PEPCID) 20 MG tablet Take 1 tablet (20  mg total) by mouth 2 (two) times daily for 7 days. 08/15/18 08/22/18  Nita SickleVeronese, [place], MD  glucosamine-chondroitin ([employer] GLUCOSAMINE-CHONDROITIN) 500-400 MG tablet Take 1 tablet by mouth daily.    [provider]  HYDROcodone-acetaminophen (NORCO) 7.5-325 MG tablet Take 1 tablet by mouth every 6 (six) hours as needed for moderate pain. 03/20/16   Kennedy BuckerMenz, Michael, MD  Multiple Vitamin (MULTI-VITAMINS) TABS Take 1 tablet by mouth daily.    [provider]  Omega-3 Fatty Acids (FISH OIL) 1000 MG CAPS Take 1 capsule by mouth daily.    [provider]  ondansetron (ZOFRAN ODT) 4 MG disintegrating tablet Take 1 tablet (4 mg total) by mouth every 8 (eight) hours as needed. 08/15/18   Nita SickleVeronese, [place], MD  pseudoephedrine (SUDAFED) 30 MG tablet Take 30 mg by mouth every morning.    [provider]    Allergies Patient has no known allergies.  FH Heart attack - father  Social History Social History   Tobacco Use  . Smoking status: Never Smoker  . Smokeless tobacco: Never Used  Substance Use Topics  . Alcohol use: No    Comment: RARE  . Drug use: Yes    Types: Marijuana    Comment: pt denies drug use but someone had already put that pt used marijuana in [facility] previously    Review of Systems  Constitutional: Negative for fever. Eyes: Negative for visual changes. ENT: Negative for sore throat. Neck:  No neck pain  Cardiovascular: Negative for chest pain. Respiratory: + shortness of breath. Gastrointestinal: + epigastric abdominal pain, nausea, vomiting. No diarrhea. Genitourinary: Negative for dysuria. Musculoskeletal: Negative for back pain. Skin: Negative for rash. Neurological: Negative for headaches, weakness or numbness. Psych: No SI or HI  ____________________________________________   PHYSICAL EXAM:  VITAL SIGNS: ED Triage Vitals  Enc Vitals Group     BP 08/15/18 0934 (!) 146/82     Pulse Rate 08/15/18 0934 (!) 59     Resp 08/15/18 0934  18     Temp 08/15/18 0934 97.9 F (36.6 C)     Temp Source 08/15/18 0934 Oral     SpO2 08/15/18 0934 100 %     Weight 08/15/18 0927 160 lb (72.6 kg)     Height 08/15/18 0927 5\' 11"  (1.803 m)     Head Circumference --      Peak Flow --      Pain Score 08/15/18 0927 7     Pain Loc --      Pain Edu? --      Excl. in GC? --     Constitutional: Alert and oriented. Well appearing and in no apparent distress. HEENT:      Head: Normocephalic and atraumatic.         Eyes: Conjunctivae are normal. Sclera is non-icteric.       Mouth/Throat: Mucous membranes are moist.       Neck: Supple with no signs of meningismus. Cardiovascular: Regular rate and rhythm. No murmurs, gallops, or rubs. 2+ symmetrical distal pulses are present in all extremities. No JVD. Respiratory: Normal respiratory effort. Lungs are clear to auscultation bilaterally. No wheezes, crackles, or rhonchi.  Gastrointestinal: Soft, mild epigastric tenderness to palpation, and non distended with positive bowel sounds. No rebound or guarding. Genitourinary: No CVA tenderness. Musculoskeletal: Nontender with normal range of motion in all extremities. No edema, cyanosis, or erythema of extremities. Neurologic: Normal speech and language. Face is symmetric. Moving all extremities. No gross focal neurologic deficits are appreciated. Skin: Skin is warm, dry and intact. No rash noted. Psychiatric: Mood and affect are normal. Speech and behavior are normal.  ____________________________________________   LABS (all labs ordered are listed, but only abnormal results are displayed)  Labs Reviewed  BASIC METABOLIC PANEL - Abnormal; Notable for the following components:      Result Value   Glucose, Bld 141 (*)    BUN 21 (*)    All other components within normal limits  CBC - Abnormal; Notable for the following components:   WBC 14.5 (*)    All other components within normal limits  TROPONIN I  HEPATIC FUNCTION PANEL  LIPASE, BLOOD    TROPONIN I   ____________________________________________  EKG  ED ECG REPORT I, Nita Sicklearolina Cyenna Rebello, the attending physician, personally viewed and interpreted this ECG.  Normal sinus rhythm, rate of 63, normal intervals, normal axis, no ST elevations or depressions.  Normal EKG. ____________________________________________  RADIOLOGY  I have personally reviewed the images performed during this visit and I agree with the Radiologist's read.   Interpretation by Radiologist:  Dg Chest 2 View  Result Date: 08/15/2018 CLINICAL DATA:  Patient with vomiting. EXAM: CHEST - 2 VIEW COMPARISON:  None. FINDINGS: Normal cardiac and mediastinal contours. Bibasilar linear opacities. Biapical pleuroparenchymal thickening. No pleural effusion or pneumothorax. Thoracic spine degenerative changes. IMPRESSION: No acute cardiopulmonary process. Probable basilar atelectasis. Electronically Signed   By: Annia Beltrew  Davis M.D.   On: 08/15/2018 10:45  Dg Abdomen 1 View  Result Date: 08/15/2018 CLINICAL DATA:  Epigastric pain and vomiting. EXAM: ABDOMEN - 1 VIEW COMPARISON:  None. FINDINGS: There is no evidence for gaseous bowel dilation to suggest obstruction. No unexpected abdominopelvic calcification. Visualized bony anatomy is unremarkable. IMPRESSION: Negative. Electronically Signed   By: Kennith Center M.D.   On: 08/15/2018 13:42     ____________________________________________   PROCEDURES  Procedure(s) performed: None Procedures Critical Care performed:  None ____________________________________________   INITIAL IMPRESSION / ASSESSMENT AND PLAN / ED COURSE  58 y.o. male with a history of chronic kidney disease who presents for evaluation of epigastric pain with nausea and vomiting.  Patient is well-appearing in no distress with normal vital signs, abdomen is soft with mild epigastric tenderness with no rebound or guarding.  EKG is normal with no ischemic changes.  Ddx gastritis, GERD, PUD,  pancreatitis, GB pathology, ACS  We will check CBC, CMP, lipase, troponin x2, chest x-ray, KUB. Will give IV pepcid, zofran, IVF, GI cocktail  Clinical Course as of Aug 15 1352  Sun Aug 15, 2018  1352 Patient's pain fully resolved after IV Pepcid, Zofran and a GI cocktail.  LFTs, lipase, BMP, troponin x2 all within normal limits.  WBC slightly elevated at 14.5 consistent with food poisoning versus gastritis.  Recommended bland diet for the next 24 hours and follow-up with primary care doctor.  Discussed and return precautions.   [CV]    Clinical Course User Index [CV] Don Perking [place], MD     As part of my medical decision making, I reviewed the following data within the electronic MEDICAL RECORD NUMBER Nursing notes reviewed and incorporated, Labs reviewed , EKG interpreted , Old chart reviewed, Radiograph reviewed , Notes from prior ED visits and [place] Controlled Substance Database    Pertinent labs & imaging results that were available during my care of the patient were reviewed by me and considered in my medical decision making (see chart for details).    ____________________________________________   FINAL CLINICAL IMPRESSION(S) / ED DIAGNOSES  Final diagnoses:  Epigastric pain  Non-intractable vomiting with nausea, unspecified vomiting type  Acute gastritis without hemorrhage, unspecified gastritis type      NEW MEDICATIONS STARTED DURING THIS VISIT:  ED Discharge Orders         Ordered    ondansetron (ZOFRAN ODT) 4 MG disintegrating tablet  Every 8 hours PRN     08/15/18 1353    famotidine (PEPCID) 20 MG tablet  2 times daily     08/15/18 1353           Note:  This document was prepared using Dragon voice recognition software and may include unintentional dictation errors.    Don Perking, [place], MD 08/15/18 [phone]

## 2018-08-15 NOTE — Discharge Instructions (Signed)

## 2018-09-14 ENCOUNTER — Other Ambulatory Visit: Payer: Self-pay | Admitting: Internal Medicine

## 2018-09-14 DIAGNOSIS — R1013 Epigastric pain: Secondary | ICD-10-CM

## 2018-09-21 ENCOUNTER — Ambulatory Visit
Admission: RE | Admit: 2018-09-21 | Discharge: 2018-09-21 | Disposition: A | Discharge status: Home / Self Care | Payer: Self-pay | Source: Ambulatory Visit | Attending: Internal Medicine | Admitting: Internal Medicine

## 2018-09-21 DIAGNOSIS — R1013 Epigastric pain: Secondary | ICD-10-CM | POA: Insufficient documentation

## 2019-02-21 ENCOUNTER — Encounter: Payer: Self-pay | Admitting: General Surgery

## 2019-03-01 ENCOUNTER — Telehealth: Payer: Self-pay | Admitting: *Deleted

## 2019-03-01 NOTE — Telephone Encounter (Signed)
Called patient to let him know that we had him scheduled for an appointment with Dr.Byrnett in August for colonoscopy recall. Patient now aware that Dr.Byrnett is no longer with the practice.  Patient was advise that we can send a referral to [facility] or he can contact his PCP to see what he recommends.  Patient decided that he wants to contact his PCP about who he recommends.

## 2019-03-24 ENCOUNTER — Ambulatory Visit: Payer: Self-pay | Admitting: General Surgery

## 2019-05-10 IMAGING — RF DG UGI W/ HIGH DENSITY W/O KUB
14 of 19 series · 14 of 20 positions shown · non-contrast
Comparison: KUB 08/15/2018.

CLINICAL DATA: GERD.

EXAM:
UPPER GI SERIES WITHOUT KUB
TECHNIQUE: Routine upper GI series was performed with high density and thin
barium.
FLUOROSCOPY TIME:  Fluoroscopy Time:  2 minutes 6 seconds
Radiation Exposure Index (if provided by the fluoroscopic device):
32.1 mGy
Number of Acquired Spot Images: 20

[Series 1: fluoro_barium 2fps_bw · 0.17mm/px · 1 of 1 slices shown (1 of 14)]
[im 1/1]
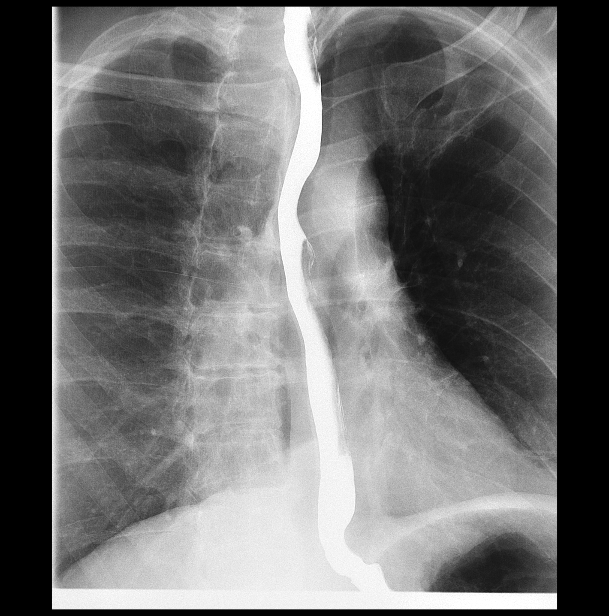

[Series 3: fluoro_barium 2fps_bw · 0.17mm/px · 1 of 1 slices shown (2 of 14)]
[im 1/1]
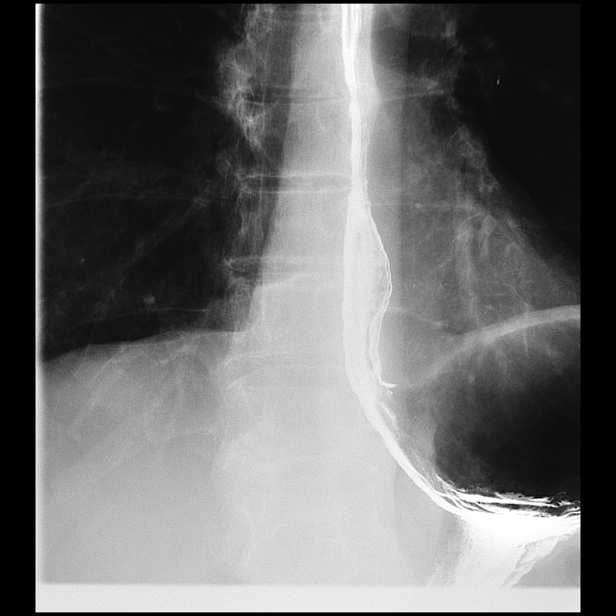

[Series 4: fluoro_barium 2fps_bw · 0.18mm/px · 1 of 2 frames shown (3 of 14)]
[frame 1/2]
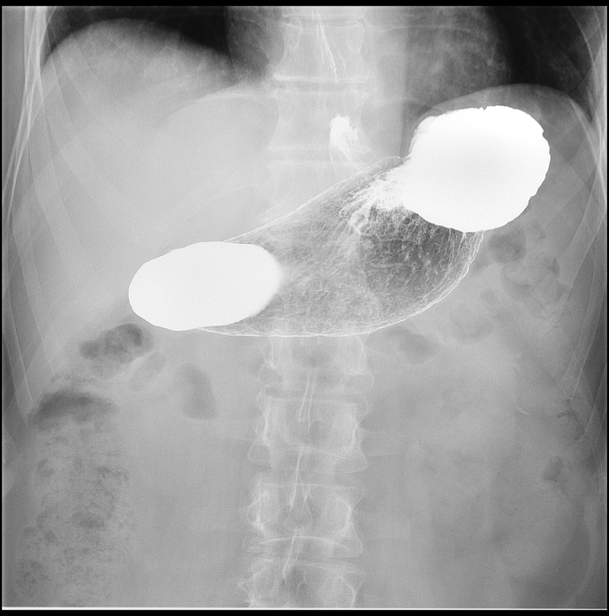

[Series 5: fluoro_barium 2fps_bw · 0.18mm/px · 1 of 1 slices shown (4 of 14)]
[im 1/1]
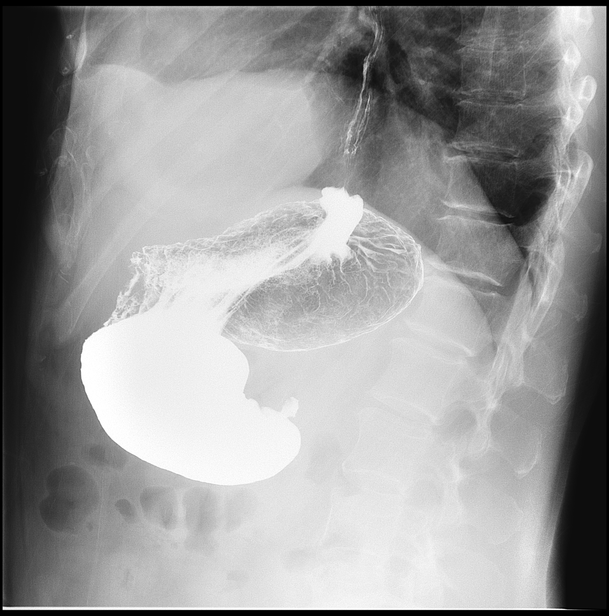

[Series 6: fluoro_barium 2fps_bw · 0.18mm/px · 1 of 1 slices shown (5 of 14)]
[im 1/1]
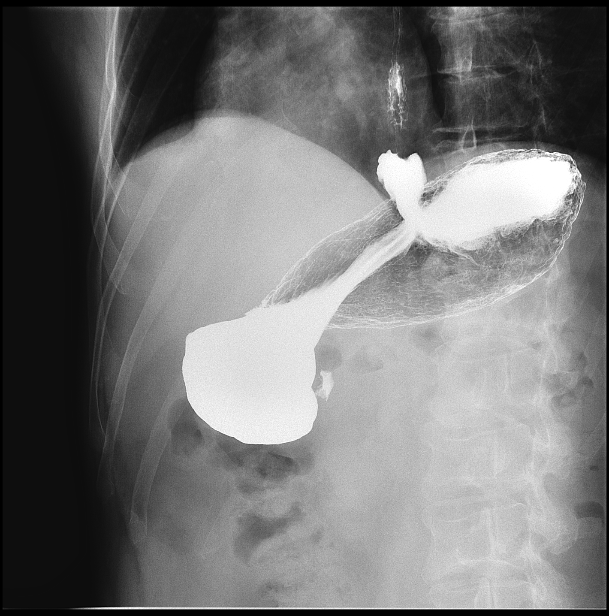

[Series 7: fluoro_barium 2fps_bw · 0.18mm/px · 1 of 1 slices shown (6 of 14)]
[im 1/1]
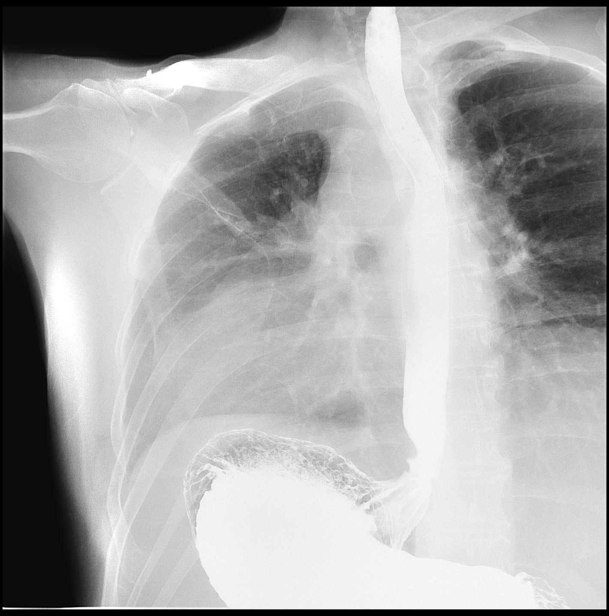

[Series 9: fluoro_barium 2fps_bw · 0.18mm/px · 1 of 1 slices shown (7 of 14)]
[im 1/1]
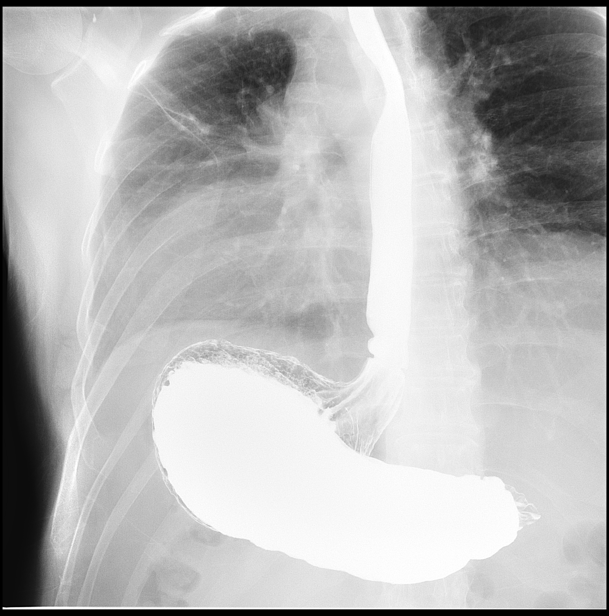

[Series 10: fluoro_barium 2fps_bw · 0.18mm/px · 1 of 1 slices shown (8 of 14)]
[im 1/1]
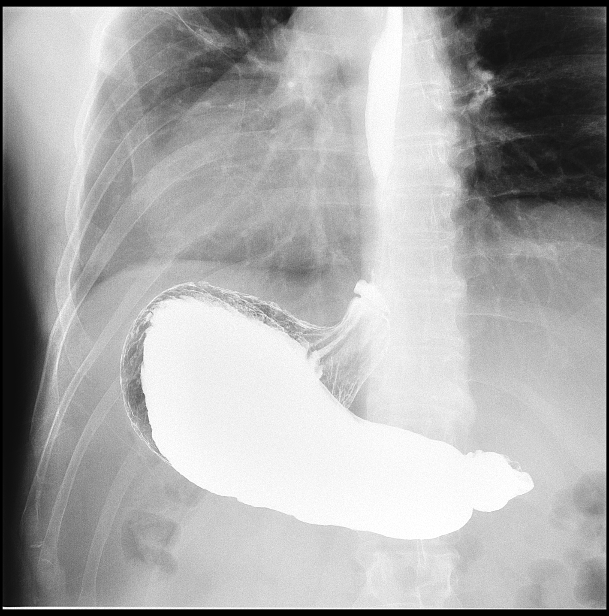

[Series 12: fluoro_barium 2fps_bw · 0.18mm/px · 1 of 1 slices shown (9 of 14)]
[im 1/1]
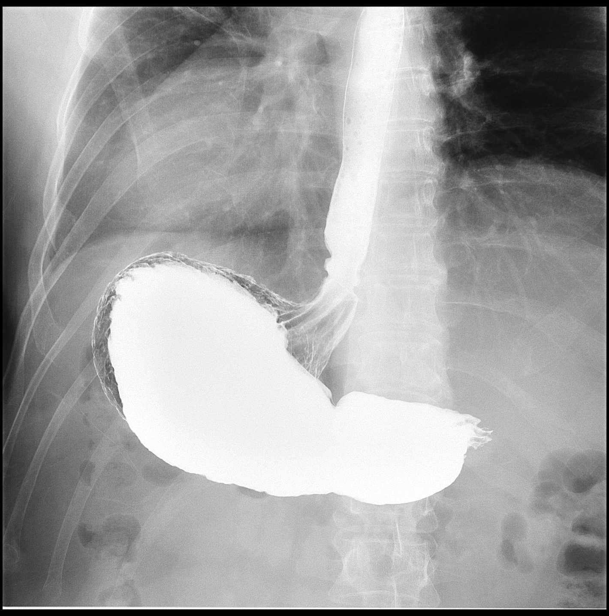

[Series 13: fluoro_barium 2fps_bw · 0.18mm/px · 1 of 1 slices shown (10 of 14)]
[im 1/1]
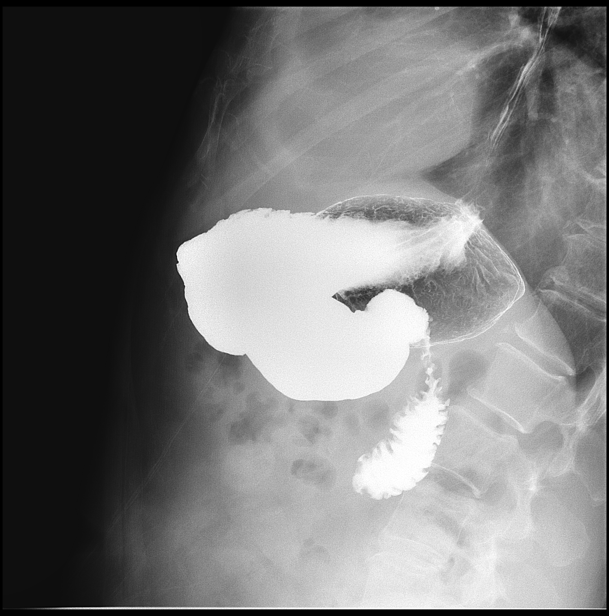

[Series 15: fluoro_barium 2fps_bw · 0.18mm/px · 1 of 1 slices shown (11 of 14)]
[im 1/1]
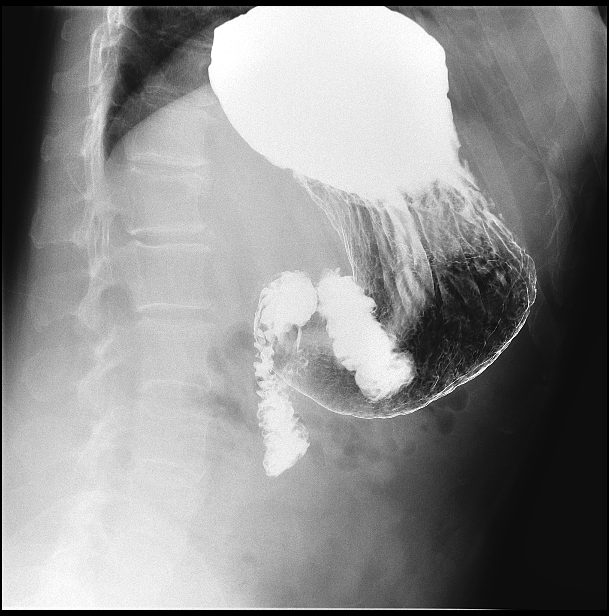

[Series 16: fluoro_barium 2fps_bw · 0.18mm/px · 1 of 1 slices shown (12 of 14)]
[im 1/1]
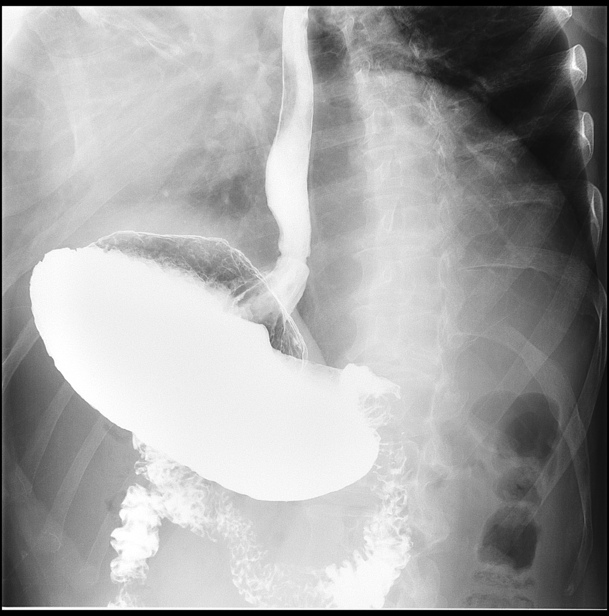

[Series 17: fluoro_barium 2fps_bw · 0.18mm/px · 1 of 1 slices shown (13 of 14)]
[im 1/1]
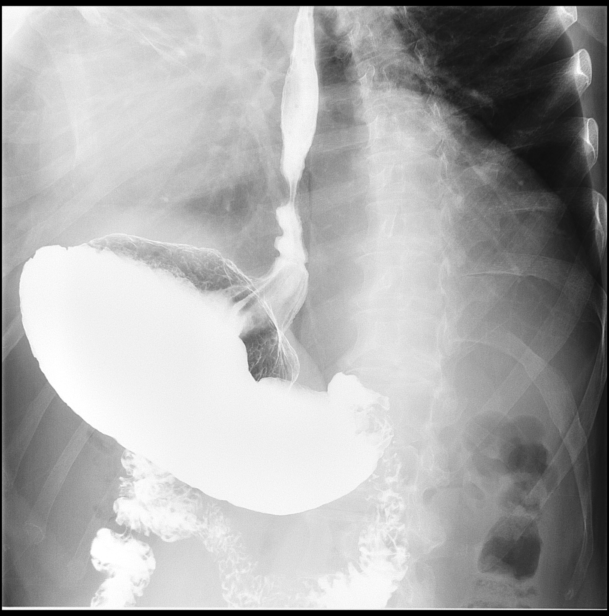

[Series 19: fluoro_barium 2fps_bw · 0.18mm/px · 1 of 1 slices shown (14 of 14)]
[im 1/1]
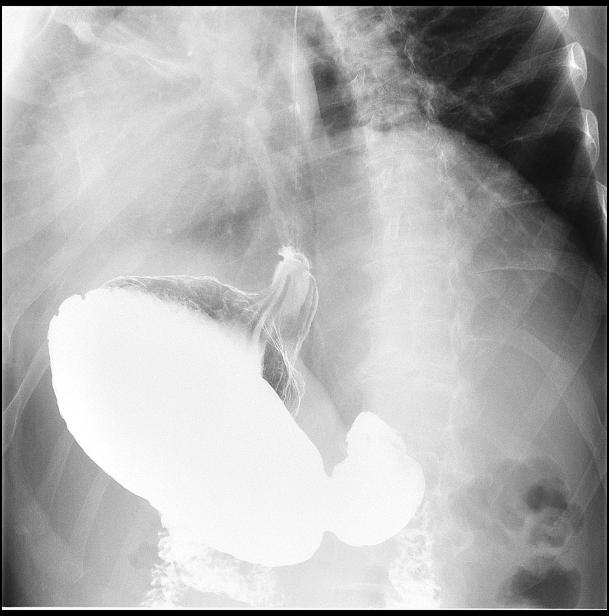

[14 of 20 positions shown; findings below may reference images not displayed]

FINDINGS: Mild irregularity of the distal esophagus is noted. This could be
related to esophagitis. A small distal esophageal ulcer just above
the gastroesophageal esophageal junction can not be completely
excluded. Mild B ring noted. No obstructing abnormality identified.
Stomach and duodenum are normal. C-loop is normal. No reflux
demonstrate.
IMPRESSION: 1. Mild irregularity of the distal esophagus is noted. This could be
related esophagitis. A small distal esophageal ulcer just above the
gastroesophageal junction can not be completely excluded. Mild B
ring noted. Endoscopy should be considered for further evaluation.
No reflux noted.

2.  Stomach and duodenum are normal.

## 2020-10-17 LAB — EXTERNAL GENERIC LAB PROCEDURE: COLOGUARD: NEGATIVE

## 2021-10-01 DIAGNOSIS — I10 Essential (primary) hypertension: Secondary | ICD-10-CM | POA: Diagnosis not present

## 2021-10-01 DIAGNOSIS — Z79899 Other long term (current) drug therapy: Secondary | ICD-10-CM | POA: Diagnosis not present

## 2021-10-01 DIAGNOSIS — Z125 Encounter for screening for malignant neoplasm of prostate: Secondary | ICD-10-CM | POA: Diagnosis not present

## 2021-10-01 DIAGNOSIS — E7800 Pure hypercholesterolemia, unspecified: Secondary | ICD-10-CM | POA: Diagnosis not present

## 2021-10-01 DIAGNOSIS — R7309 Other abnormal glucose: Secondary | ICD-10-CM | POA: Diagnosis not present

## 2021-10-08 DIAGNOSIS — I10 Essential (primary) hypertension: Secondary | ICD-10-CM | POA: Diagnosis not present

## 2021-10-08 DIAGNOSIS — E7800 Pure hypercholesterolemia, unspecified: Secondary | ICD-10-CM | POA: Diagnosis not present

## 2021-10-08 DIAGNOSIS — Z720 Tobacco use: Secondary | ICD-10-CM | POA: Diagnosis not present

## 2021-10-08 DIAGNOSIS — Z0000 Encounter for general adult medical examination without abnormal findings: Secondary | ICD-10-CM | POA: Diagnosis not present

## 2022-03-07 DIAGNOSIS — H00012 Hordeolum externum right lower eyelid: Secondary | ICD-10-CM | POA: Diagnosis not present

## 2022-04-03 DIAGNOSIS — Z79899 Other long term (current) drug therapy: Secondary | ICD-10-CM | POA: Diagnosis not present

## 2022-04-03 DIAGNOSIS — E7800 Pure hypercholesterolemia, unspecified: Secondary | ICD-10-CM | POA: Diagnosis not present

## 2022-04-03 DIAGNOSIS — R7309 Other abnormal glucose: Secondary | ICD-10-CM | POA: Diagnosis not present

## 2022-05-19 DIAGNOSIS — R739 Hyperglycemia, unspecified: Secondary | ICD-10-CM | POA: Diagnosis not present

## 2022-05-19 DIAGNOSIS — Z720 Tobacco use: Secondary | ICD-10-CM | POA: Diagnosis not present

## 2022-05-19 DIAGNOSIS — Z125 Encounter for screening for malignant neoplasm of prostate: Secondary | ICD-10-CM | POA: Diagnosis not present

## 2022-05-19 DIAGNOSIS — Z79899 Other long term (current) drug therapy: Secondary | ICD-10-CM | POA: Diagnosis not present

## 2022-05-19 DIAGNOSIS — M25551 Pain in right hip: Secondary | ICD-10-CM | POA: Diagnosis not present

## 2022-05-19 DIAGNOSIS — I10 Essential (primary) hypertension: Secondary | ICD-10-CM | POA: Diagnosis not present

## 2022-05-19 DIAGNOSIS — E7800 Pure hypercholesterolemia, unspecified: Secondary | ICD-10-CM | POA: Diagnosis not present

## 2022-06-06 DIAGNOSIS — M76821 Posterior tibial tendinitis, right leg: Secondary | ICD-10-CM | POA: Diagnosis not present

## 2022-06-28 ENCOUNTER — Other Ambulatory Visit: Payer: Self-pay

## 2022-06-28 ENCOUNTER — Emergency Department
Admission: EM | Admit: 2022-06-28 | Discharge: 2022-06-28 | Disposition: A | Discharge status: Home / Self Care | Payer: 59 | Attending: Emergency Medicine | Admitting: Emergency Medicine

## 2022-06-28 DIAGNOSIS — N189 Chronic kidney disease, unspecified: Secondary | ICD-10-CM | POA: Diagnosis not present

## 2022-06-28 DIAGNOSIS — R112 Nausea with vomiting, unspecified: Secondary | ICD-10-CM | POA: Diagnosis not present

## 2022-06-28 DIAGNOSIS — K529 Noninfective gastroenteritis and colitis, unspecified: Secondary | ICD-10-CM | POA: Diagnosis not present

## 2022-06-28 LAB — COMPREHENSIVE METABOLIC PANEL
ALT: 29 U/L (ref 0–44)
AST: 25 U/L (ref 15–41)
Albumin: 4.2 g/dL (ref 3.5–5.0)
Alkaline Phosphatase: 56 U/L (ref 38–126)
Anion gap: 9 (ref 5–15)
BUN: 23 mg/dL (ref 8–23)
CO2: 23 mmol/L (ref 22–32)
Calcium: 9.4 mg/dL (ref 8.9–10.3)
Chloride: 108 mmol/L (ref 98–111)
Creatinine, Ser: 0.92 mg/dL (ref 0.61–1.24)
GFR, Estimated: >60 mL/min (ref >60)
Glucose, Bld: 138 mg/dL — ABNORMAL HIGH (ref 70–99)
Potassium: 4.2 mmol/L (ref 3.5–5.1)
Sodium: 140 mmol/L (ref 135–145)
Total Bilirubin: 0.6 mg/dL (ref 0.3–1.2)
Total Protein: 7.4 g/dL (ref 6.5–8.1)

## 2022-06-28 LAB — URINALYSIS, ROUTINE W REFLEX MICROSCOPIC
Bilirubin Urine: NEGATIVE
Glucose, UA: NEGATIVE mg/dL
Hgb urine dipstick: NEGATIVE
Ketones, ur: 5 mg/dL — AB
Leukocytes,Ua: NEGATIVE
Nitrite: NEGATIVE
Protein, ur: NEGATIVE mg/dL
Specific Gravity, Urine: 1.026 (ref 1.005–1.030)
pH: 5 (ref 5.0–8.0)

## 2022-06-28 LAB — CBC
HCT: 42.3 % (ref 39.0–52.0)
Hemoglobin: 14.6 g/dL (ref 13.0–17.0)
MCH: 30.8 pg (ref 26.0–34.0)
MCHC: 34.5 g/dL (ref 30.0–36.0)
MCV: 89.2 fL (ref 80.0–100.0)
Platelets: 367 10*3/uL (ref 150–400)
RBC: 4.74 MIL/uL (ref 4.22–5.81)
RDW: 12.5 % (ref 11.5–15.5)
WBC: 15.5 10*3/uL — ABNORMAL HIGH (ref 4.0–10.5)
nRBC: 0 % (ref 0.0–0.2)

## 2022-06-28 LAB — LIPASE, BLOOD: Lipase: 34 U/L (ref 11–51)

## 2022-06-28 MED ORDER — ONDANSETRON 4 MG PO TBDP
4.0000 mg | ORAL_TABLET | Freq: Once | ORAL | Status: AC | PRN
  Administered 2022-06-28: 4 mg via ORAL
  Filled 2022-06-28: qty 1

## 2022-06-28 MED ORDER — ONDANSETRON 4 MG PO TBDP: ORAL_TABLET | ORAL | 0 refills | Status: AC

## 2022-06-28 MED ORDER — ONDANSETRON HCL 4 MG/2ML IJ SOLN
4.0000 mg | Freq: Once | INTRAMUSCULAR | Status: AC
  Administered 2022-06-28: 4 mg via INTRAVENOUS
  Filled 2022-06-28: qty 2

## 2022-06-28 MED ORDER — SODIUM CHLORIDE 0.9 % IV BOLUS
1000.0000 mL | Freq: Once | INTRAVENOUS | Status: AC
  Administered 2022-06-28: 1000 mL via INTRAVENOUS

## 2022-06-28 NOTE — Discharge Instructions (Signed)
Follow-up with your primary care provider if any continued problems or concerns.  You may also go to the walk-in clinic at [facility] if needed.  For the next 24 hours remain on clear liquids.  If you are tolerating fluids well after that time you may advance your diet slowly to include applesauce, plain toast, rice and if tolerated advance your diet slowly.  Because you are drinking fluids the remainder of the day the diarrhea should improve as you will no longer have anything in your stomach to have diarrhea with.  A prescription for Zofran was sent to the pharmacy to take 1 or 2 every 8 hours as needed for nausea and vomiting.  Return to the emergency department if any severe worsening of your symptoms.

## 2022-06-28 NOTE — ED Triage Notes (Signed)
Pt to ED for diarrhea, emesis that started this am. C/o discomfort in abd.  States has had cold for 2 weeks.

## 2022-06-28 NOTE — ED Notes (Signed)
Pt given urine cup at this time. Denies that he will be able to provide a sample.

## 2022-06-28 NOTE — ED Provider Notes (Signed)
[facility] Provider Note    Event Date/Time   First MD Initiated Contact with Patient 06/28/22 [phone]     (approximate)   History   Emesis   HPI  Harold Ayala is a 61 y.o. male   presents to the ED with with complaint of diarrhea and emesis that started this morning.  Patient states he has been willing with a cold for the last 2 weeks.  He complains of discomfort in his abdomen and not feeling well.  Patient was given Zofran while in the triage area but reports that he vomited this while in the waiting room.  He denies any fever, chills, body aches and no known sick exposures.  Patient has history of chronic kidney disease.      Physical Exam   Triage Vital Signs: ED Triage Vitals  Enc Vitals Group     BP 06/28/22 0921 (!) 146/97     Pulse Rate 06/28/22 0920 78     Resp 06/28/22 0920 20     Temp 06/28/22 0920 97.9 F (36.6 C)     Temp Source 06/28/22 0920 Oral     SpO2 06/28/22 0920 96 %     Weight 06/28/22 0921 170 lb (77.1 kg)     Height 06/28/22 0921 5\' 11"  (1.803 m)     Head Circumference --      Peak Flow --      Pain Score 06/28/22 0920 5     Pain Loc --      Pain Edu? --      Excl. in GC? --     Most recent vital signs: Vitals:   06/28/22 0920 06/28/22 0921  BP:  (!) 146/97  Pulse: 78   Resp: 20   Temp: 97.9 F (36.6 C)   SpO2: 96%      General: Awake, no distress.  Patient does appear ill and currently having some nausea. CV:  Good peripheral perfusion.  Resp:  Normal effort.  Lungs are clear bilaterally. Abd:  No distention.  Bowel sounds are hyperactive at this time.  No localized or point tenderness is noted. Other:     ED Results / Procedures / Treatments   Labs (all labs ordered are listed, but only abnormal results are displayed) Labs Reviewed  COMPREHENSIVE METABOLIC PANEL - Abnormal; Notable for the following components:      Result Value   Glucose, Bld 138 (*)    All other components within normal limits   CBC - Abnormal; Notable for the following components:   WBC 15.5 (*)    All other components within normal limits  URINALYSIS, ROUTINE W REFLEX MICROSCOPIC - Abnormal; Notable for the following components:   Color, Urine YELLOW (*)    APPearance CLEAR (*)    Ketones, ur 5 (*)    All other components within normal limits  LIPASE, BLOOD       PROCEDURES:  Critical Care performed:   Procedures   MEDICATIONS ORDERED IN ED: Medications  ondansetron (ZOFRAN-ODT) disintegrating tablet 4 mg (4 mg Oral Given 06/28/22 0926)  sodium chloride 0.9 % bolus 1,000 mL (1,000 mLs Intravenous New Bag/Given 06/28/22 1057)  ondansetron (ZOFRAN) injection 4 mg (4 mg Intravenous Given 06/28/22 1057)     IMPRESSION / MDM / ASSESSMENT AND PLAN / ED COURSE  I reviewed the triage vital signs and the nursing notes.   Differential diagnosis includes, but is not limited to, viral gastroenteritis, upper respiratory infection, urinary tract infection.  61 year old male presents to the ED with sudden onset of nausea, vomiting and diarrhea that began this morning.  Patient denies any illness prior to this.  He is unaware of any known exposure.  Patient was given Zofran while in the ED but stated while being seen in flex that he was unable to keep the Zofran down even though he had melted on his tongue.  He was given a liter of fluids and IV Zofran and no continued vomiting was noted.  Patient was then offered a p.o. challenge which he did well with.  I explained to him that all his labs were within normal limits with someone having a viral gastroenteritis.  He is encouraged to stay on clear liquids for the next 24 hours and then slowly advance his diet as tolerated.  A prescription for Zofran was sent to his pharmacy.  He is to follow-up with his PCP if any continued problems.      Patient's presentation is most consistent with acute complicated illness / injury requiring diagnostic workup.  FINAL CLINICAL  IMPRESSION(S) / ED DIAGNOSES   Final diagnoses:  Gastroenteritis     Rx / DC Orders   ED Discharge Orders          Ordered    ondansetron (ZOFRAN-ODT) 4 MG disintegrating tablet        06/28/22 1328             Note:  This document was prepared using Dragon voice recognition software and may include unintentional dictation errors.   Tommi Rumps, PA-C 06/28/22 1339    Pilar Jarvis, MD 06/28/22 [phone]

## 2022-06-28 NOTE — ED Provider Triage Note (Signed)
Emergency Medicine Provider Triage Evaluation Note  JENESIS SUCHY , a 61 y.o. male  was evaluated in triage.  Pt complains of cough, congestion x 2 weeks. This morning began with vomiting and diarrhea.    Review of Systems  Positive: Aches, + chills Negative: No fever  Physical Exam  BP (!) 146/97   Pulse 78   Temp 97.9 F (36.6 C) (Oral)   Resp 20   Ht 5\' 11"  (1.803 m)   Wt 77.1 kg   SpO2 96%   BMI 23.71 kg/m  Gen:   Awake, no distress   Resp:  Normal effort    Lungs clear MSK:   Moves extremities without difficulty.  Ambulatory Other:    Medical Decision Making  Medically screening exam initiated at 9:23 AM.  Appropriate orders placed.  FERLANDO LIA was informed that the remainder of the evaluation will be completed by another provider, this initial triage assessment does not replace that evaluation, and the importance of remaining in the ED until their evaluation is complete.     Deforest Hoyles, PA-C 06/28/22 [phone]

## 2022-10-06 DIAGNOSIS — Z79899 Other long term (current) drug therapy: Secondary | ICD-10-CM | POA: Diagnosis not present

## 2022-10-06 DIAGNOSIS — R739 Hyperglycemia, unspecified: Secondary | ICD-10-CM | POA: Diagnosis not present

## 2022-10-06 DIAGNOSIS — E7800 Pure hypercholesterolemia, unspecified: Secondary | ICD-10-CM | POA: Diagnosis not present

## 2022-10-06 DIAGNOSIS — I10 Essential (primary) hypertension: Secondary | ICD-10-CM | POA: Diagnosis not present

## 2022-10-06 DIAGNOSIS — Z125 Encounter for screening for malignant neoplasm of prostate: Secondary | ICD-10-CM | POA: Diagnosis not present

## 2022-10-13 DIAGNOSIS — I10 Essential (primary) hypertension: Secondary | ICD-10-CM | POA: Diagnosis not present

## 2022-10-13 DIAGNOSIS — M79641 Pain in right hand: Secondary | ICD-10-CM | POA: Diagnosis not present

## 2022-10-13 DIAGNOSIS — E7800 Pure hypercholesterolemia, unspecified: Secondary | ICD-10-CM | POA: Diagnosis not present

## 2022-10-13 DIAGNOSIS — Z79899 Other long term (current) drug therapy: Secondary | ICD-10-CM | POA: Diagnosis not present

## 2022-10-13 DIAGNOSIS — R739 Hyperglycemia, unspecified: Secondary | ICD-10-CM | POA: Diagnosis not present

## 2022-10-13 DIAGNOSIS — Z0000 Encounter for general adult medical examination without abnormal findings: Secondary | ICD-10-CM | POA: Diagnosis not present

## 2022-10-21 DIAGNOSIS — H6591 Unspecified nonsuppurative otitis media, right ear: Secondary | ICD-10-CM | POA: Diagnosis not present

## 2022-10-21 DIAGNOSIS — J069 Acute upper respiratory infection, unspecified: Secondary | ICD-10-CM | POA: Diagnosis not present

## 2022-10-21 DIAGNOSIS — R197 Diarrhea, unspecified: Secondary | ICD-10-CM | POA: Diagnosis not present

## 2022-10-30 DIAGNOSIS — M65341 Trigger finger, right ring finger: Secondary | ICD-10-CM | POA: Diagnosis not present

## 2023-03-04 DIAGNOSIS — H9211 Otorrhea, right ear: Secondary | ICD-10-CM | POA: Diagnosis not present

## 2023-04-01 DIAGNOSIS — H60541 Acute eczematoid otitis externa, right ear: Secondary | ICD-10-CM | POA: Diagnosis not present

## 2023-04-15 DIAGNOSIS — E7800 Pure hypercholesterolemia, unspecified: Secondary | ICD-10-CM | POA: Diagnosis not present

## 2023-04-15 DIAGNOSIS — Z79899 Other long term (current) drug therapy: Secondary | ICD-10-CM | POA: Diagnosis not present

## 2023-04-15 DIAGNOSIS — R739 Hyperglycemia, unspecified: Secondary | ICD-10-CM | POA: Diagnosis not present

## 2023-04-16 DIAGNOSIS — H60541 Acute eczematoid otitis externa, right ear: Secondary | ICD-10-CM | POA: Diagnosis not present

## 2023-04-22 DIAGNOSIS — R739 Hyperglycemia, unspecified: Secondary | ICD-10-CM | POA: Diagnosis not present

## 2023-04-22 DIAGNOSIS — E7800 Pure hypercholesterolemia, unspecified: Secondary | ICD-10-CM | POA: Diagnosis not present

## 2023-04-22 DIAGNOSIS — Z125 Encounter for screening for malignant neoplasm of prostate: Secondary | ICD-10-CM | POA: Diagnosis not present

## 2023-04-22 DIAGNOSIS — Z2821 Immunization not carried out because of patient refusal: Secondary | ICD-10-CM | POA: Diagnosis not present

## 2023-04-22 DIAGNOSIS — Z87898 Personal history of other specified conditions: Secondary | ICD-10-CM | POA: Diagnosis not present

## 2023-04-22 DIAGNOSIS — Z79899 Other long term (current) drug therapy: Secondary | ICD-10-CM | POA: Diagnosis not present

## 2023-04-22 DIAGNOSIS — I10 Essential (primary) hypertension: Secondary | ICD-10-CM | POA: Diagnosis not present

## 2023-05-09 ENCOUNTER — Emergency Department: Payer: 59

## 2023-05-09 ENCOUNTER — Emergency Department
Admission: EM | Admit: 2023-05-09 | Discharge: 2023-05-09 | Disposition: A | Discharge status: Home / Self Care | Payer: 59 | Attending: Emergency Medicine | Admitting: Emergency Medicine

## 2023-05-09 ENCOUNTER — Other Ambulatory Visit: Payer: Self-pay

## 2023-05-09 DIAGNOSIS — R197 Diarrhea, unspecified: Secondary | ICD-10-CM | POA: Insufficient documentation

## 2023-05-09 DIAGNOSIS — R109 Unspecified abdominal pain: Secondary | ICD-10-CM | POA: Insufficient documentation

## 2023-05-09 DIAGNOSIS — I10 Essential (primary) hypertension: Secondary | ICD-10-CM | POA: Diagnosis not present

## 2023-05-09 DIAGNOSIS — K429 Umbilical hernia without obstruction or gangrene: Secondary | ICD-10-CM | POA: Diagnosis not present

## 2023-05-09 DIAGNOSIS — K5730 Diverticulosis of large intestine without perforation or abscess without bleeding: Secondary | ICD-10-CM | POA: Diagnosis not present

## 2023-05-09 DIAGNOSIS — K449 Diaphragmatic hernia without obstruction or gangrene: Secondary | ICD-10-CM | POA: Diagnosis not present

## 2023-05-09 DIAGNOSIS — R112 Nausea with vomiting, unspecified: Secondary | ICD-10-CM | POA: Insufficient documentation

## 2023-05-09 LAB — COMPREHENSIVE METABOLIC PANEL
ALT: 24 U/L (ref 0–44)
AST: 22 U/L (ref 15–41)
Albumin: 4.2 g/dL (ref 3.5–5.0)
Alkaline Phosphatase: 46 U/L (ref 38–126)
Anion gap: 9 (ref 5–15)
BUN: 26 mg/dL — ABNORMAL HIGH (ref 8–23)
CO2: 22 mmol/L (ref 22–32)
Calcium: 9 mg/dL (ref 8.9–10.3)
Chloride: 108 mmol/L (ref 98–111)
Creatinine, Ser: 0.99 mg/dL (ref 0.61–1.24)
GFR, Estimated: >60 mL/min (ref >60)
Glucose, Bld: 121 mg/dL — ABNORMAL HIGH (ref 70–99)
Potassium: 4.3 mmol/L (ref 3.5–5.1)
Sodium: 139 mmol/L (ref 135–145)
Total Bilirubin: 0.7 mg/dL (ref 0.3–1.2)
Total Protein: 7.2 g/dL (ref 6.5–8.1)

## 2023-05-09 LAB — CBC
HCT: 43.2 % (ref 39.0–52.0)
Hemoglobin: 14.5 g/dL (ref 13.0–17.0)
MCH: 30.6 pg (ref 26.0–34.0)
MCHC: 33.6 g/dL (ref 30.0–36.0)
MCV: 91.1 fL (ref 80.0–100.0)
Platelets: 383 10*3/uL (ref 150–400)
RBC: 4.74 MIL/uL (ref 4.22–5.81)
RDW: 12.7 % (ref 11.5–15.5)
WBC: 12.8 10*3/uL — ABNORMAL HIGH (ref 4.0–10.5)
nRBC: 0 % (ref 0.0–0.2)

## 2023-05-09 LAB — URINALYSIS, ROUTINE W REFLEX MICROSCOPIC
Bilirubin Urine: NEGATIVE
Glucose, UA: NEGATIVE mg/dL
Hgb urine dipstick: NEGATIVE
Ketones, ur: NEGATIVE mg/dL
Leukocytes,Ua: NEGATIVE
Nitrite: NEGATIVE
Protein, ur: NEGATIVE mg/dL
Specific Gravity, Urine: 1.018 (ref 1.005–1.030)
pH: 5 (ref 5.0–8.0)

## 2023-05-09 LAB — LIPASE, BLOOD: Lipase: 38 U/L (ref 11–51)

## 2023-05-09 MED ORDER — IOHEXOL 300 MG/ML  SOLN
100.0000 mL | Freq: Once | INTRAMUSCULAR | Status: AC | PRN
  Administered 2023-05-09: 80 mL via INTRAVENOUS

## 2023-05-09 MED ORDER — ONDANSETRON HCL 4 MG/2ML IJ SOLN
4.0000 mg | Freq: Once | INTRAMUSCULAR | Status: AC
  Administered 2023-05-09: 4 mg via INTRAVENOUS
  Filled 2023-05-09: qty 2

## 2023-05-09 MED ORDER — ONDANSETRON 4 MG PO TBDP: 4.0000 mg | ORAL_TABLET | Freq: Three times a day (TID) | ORAL | 0 refills | Status: AC | PRN

## 2023-05-09 MED ORDER — METOCLOPRAMIDE HCL 5 MG/ML IJ SOLN: 10.0000 mg | Freq: Once | INTRAMUSCULAR | Status: DC

## 2023-05-09 NOTE — ED Notes (Signed)
Pt denies pain, only having vomiting since this AM (4 times).

## 2023-05-09 NOTE — Discharge Instructions (Signed)
You were seen in the Emergency Department for your nausea and vomiting. We checked lab work including your blood counts and general chemistries and these were without any remarkable abnormalities.  We provided you with medications to help treat your symptoms.  It is most likely that your symptoms today are caused by a viral infection and should improve over the next few days.  It is important that you maintain a gentle diet with plenty of clear fluids.  You can use the medication we provided you for symptomatic relief of the nausea.  You should stay well hydrated. -- Rest, Drink lots of fluids. Advance diet as tolerated, if your have worsening abdominal pain, if you are unable to keep fluids down, should you develop fever greater than 100.4, return to the Emergency Department. If you have any concerns at all, please return to the Emergency Department. -- For the next 48 hours maintain hydration with at least 8 glasses of fluid per day of clear juices- apple or cranberry, broth, jell-O, popsicles, sherbert, tea, ginger ale and dry toast and crackers. -- After 48 hours Progress your diet to soft BRAT Diet: rice, bananas, applesauce and toast: then mashed potatoes and vegetables, -- Advance as tolerated, vegetables, eggs, chicken, fish, fruit -- Refrain from dairy products, fatty greasy, spicy food for one week If you have increasing abdominal pain, bright red blood from rectum, uncontrollable nausea and vomiting, fever > 100.85F, chills, inability to hold down liquids, decrease in urination, or any symptoms
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
## 2023-05-09 NOTE — ED Triage Notes (Signed)
Pt ambulatory to ER from home for N/V which started this morning. 4x episodes of clear emesis

## 2023-05-14 DIAGNOSIS — K219 Gastro-esophageal reflux disease without esophagitis: Secondary | ICD-10-CM | POA: Diagnosis not present

## 2023-05-14 DIAGNOSIS — I10 Essential (primary) hypertension: Secondary | ICD-10-CM | POA: Diagnosis not present

## 2023-05-14 DIAGNOSIS — R112 Nausea with vomiting, unspecified: Secondary | ICD-10-CM | POA: Diagnosis not present
